# Patient Record
Sex: Female | Born: 1995 | Race: Black or African American | Hispanic: No | Marital: Single | Smoking: Current every day smoker
Health system: Southern US, Community
[De-identification: ages and names within clinical notes are randomized; demographics above are authoritative.]

## PROBLEM LIST (undated history)

## (undated) DIAGNOSIS — E282 Polycystic ovarian syndrome: Secondary | ICD-10-CM

## (undated) DIAGNOSIS — L309 Dermatitis, unspecified: Secondary | ICD-10-CM

## (undated) DIAGNOSIS — K589 Irritable bowel syndrome without diarrhea: Secondary | ICD-10-CM

## (undated) DIAGNOSIS — Z789 Other specified health status: Secondary | ICD-10-CM

## (undated) HISTORY — DX: Other specified health status: Z78.9

## (undated) HISTORY — PX: WISDOM TOOTH EXTRACTION: SHX21

## (undated) HISTORY — DX: Dermatitis, unspecified: L30.9

---
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
## 2015-05-14 ENCOUNTER — Emergency Department (INDEPENDENT_AMBULATORY_CARE_PROVIDER_SITE_OTHER)
Admission: EM | Admit: 2015-05-14 | Discharge: 2015-05-14 | Disposition: A | Discharge status: Home / Self Care | Payer: Medicaid Other | Source: Home / Self Care | Attending: Emergency Medicine | Admitting: Emergency Medicine

## 2015-05-14 ENCOUNTER — Encounter (HOSPITAL_COMMUNITY): Payer: Self-pay | Admitting: Emergency Medicine

## 2015-05-14 DIAGNOSIS — B86 Scabies: Secondary | ICD-10-CM

## 2015-05-14 MED ORDER — HYDROXYZINE HCL 25 MG PO TABS: 25.0000 mg | ORAL_TABLET | Freq: Four times a day (QID) | ORAL | Status: DC | PRN

## 2015-05-14 MED ORDER — PERMETHRIN 5 % EX CREA: TOPICAL_CREAM | CUTANEOUS | Status: DC

## 2015-05-14 NOTE — ED Provider Notes (Signed)
CSN: [id number]     Arrival date & time 05/14/15  1715 History   First MD Initiated Contact with Patient 05/14/15 1746     Chief Complaint  Patient presents with  . Rash   (Consider location/radiation/quality/duration/timing/severity/associated sxs/prior Treatment) HPI She is a 19 year old woman here for evaluation of rash. She states for the last 1-2 weeks she has had itchy bumps in the webspaces of her hands. She is also noticed a few on her wrist. They're very itchy. It gets worse at night. She also reports a few spots on her upper abdomen and behind her knees.  She lives with her sister. As far she knows, her sister does not have any spots.  History reviewed. No pertinent past medical history. History reviewed. No pertinent past surgical history. No family history on file. Social History  Substance Use Topics  . Smoking status: Current Some Day Smoker    Types: Cigarettes  . Smokeless tobacco: None  . Alcohol Use: Yes   OB History    No data available     Review of Systems As in history of present illness Allergies  Review of patient's allergies indicates no known allergies.  Home Medications   Prior to Admission medications   Medication Sig Start Date End Date Taking? Authorizing Provider  hydrOXYzine (ATARAX/VISTARIL) 25 MG tablet Take 1 tablet (25 mg total) by mouth every 6 (six) hours as needed for itching. 05/14/15   Charm RingsErin J Loletta Harper, MD  permethrin (ELIMITE) 5 % cream Apply from neck down, leave 12 hours, then rinse off.  Repeat in 1 week 05/14/15   Charm RingsErin J Hubert Raatz, MD   Meds Ordered and Administered this Visit  Medications - No data to display  BP 104/68 mmHg  Pulse 68  Temp(Src) 98 F (36.7 C) (Oral)  SpO2 100%  LMP 04/30/2015 No data found.   Physical Exam  Constitutional: She is oriented to person, place, and time. She appears well-developed and well-nourished. No distress.  Cardiovascular: Normal rate.   Pulmonary/Chest: Effort normal.  Neurological: She  is alert and oriented to person, place, and time.  Skin: Rash (papules and burrows in the inner webspaces.) noted.    ED Course  Procedures (including critical care time)  Labs Review Labs Reviewed - No data to display  Imaging Review No results found.    MDM   1. Scabies    Handout given. Permethrin. Hydroxyzine for itching. Follow-up as needed.    Charm RingsErin J Layaan Mott, MD 05/14/15 [phone]

## 2015-05-14 NOTE — ED Notes (Signed)
C/o rash on hands/arms and bilateral legs Reports she has recently moved to a new home A&O x4... No acute distress.

## 2015-05-14 NOTE — Discharge Instructions (Signed)

## 2016-02-14 ENCOUNTER — Encounter: Payer: Self-pay | Admitting: Certified Nurse Midwife

## 2016-02-20 ENCOUNTER — Ambulatory Visit (INDEPENDENT_AMBULATORY_CARE_PROVIDER_SITE_OTHER): Payer: Medicaid Other

## 2016-02-20 ENCOUNTER — Encounter: Payer: Self-pay | Admitting: Family Medicine

## 2016-02-20 DIAGNOSIS — Z3201 Encounter for pregnancy test, result positive: Secondary | ICD-10-CM | POA: Diagnosis not present

## 2016-02-20 DIAGNOSIS — Z3200 Encounter for pregnancy test, result unknown: Secondary | ICD-10-CM

## 2016-02-20 LAB — POCT PREGNANCY, URINE: Preg Test, Ur: POSITIVE — AB

## 2016-02-20 NOTE — Progress Notes (Signed)
Patient presented to office today for a pregnancy test.Test confirms she is around [redacted] weeks pregnant at this time. Patient plans to start care at [facility]. She has been given a letter of verification to apply for some assistance.

## 2016-03-14 ENCOUNTER — Encounter: Payer: Self-pay | Admitting: Certified Nurse Midwife

## 2016-03-14 ENCOUNTER — Ambulatory Visit (INDEPENDENT_AMBULATORY_CARE_PROVIDER_SITE_OTHER): Payer: Medicaid Other | Admitting: Certified Nurse Midwife

## 2016-03-14 VITALS — BP 109/70 | HR 85 | Ht 63.0 in

## 2016-03-14 DIAGNOSIS — Z1389 Encounter for screening for other disorder: Secondary | ICD-10-CM | POA: Diagnosis not present

## 2016-03-14 DIAGNOSIS — Z3482 Encounter for supervision of other normal pregnancy, second trimester: Secondary | ICD-10-CM | POA: Diagnosis not present

## 2016-03-14 DIAGNOSIS — Z3480 Encounter for supervision of other normal pregnancy, unspecified trimester: Secondary | ICD-10-CM

## 2016-03-14 DIAGNOSIS — Z3490 Encounter for supervision of normal pregnancy, unspecified, unspecified trimester: Secondary | ICD-10-CM | POA: Insufficient documentation

## 2016-03-14 DIAGNOSIS — Z331 Pregnant state, incidental: Secondary | ICD-10-CM

## 2016-03-14 DIAGNOSIS — Z3687 Encounter for antenatal screening for uncertain dates: Secondary | ICD-10-CM

## 2016-03-14 LAB — POCT URINALYSIS DIPSTICK
Bilirubin, UA: NEGATIVE
Blood, UA: 250
Glucose, UA: NEGATIVE
KETONES UA: NEGATIVE
Nitrite, UA: NEGATIVE
PH UA: 7.5
Protein, UA: NEGATIVE
Spec Grav, UA: <=1.005
Urobilinogen, UA: NEGATIVE

## 2016-03-14 MED ORDER — VITAFOL GUMMIES 3.33-0.333-34.8 MG PO CHEW: 3.0000 | CHEWABLE_TABLET | Freq: Every day | ORAL | 12 refills | Status: DC

## 2016-03-14 NOTE — Progress Notes (Signed)
Subjective:    Melody Patrick is being seen today for her first obstetrical visit.  This is not a planned pregnancy. She is at 3363w3d gestation. Her obstetrical history is significant for none, late teen prenancy, stopped smoking at start of pregnancy. Relationship with FOB: significant other, living together. Patient does not intend to breast feed. Pregnancy history fully reviewed.  The information documented in the HPI was reviewed and verified.  Menstrual History: OB History    Gravida Para Term Preterm AB Living   1             SAB TAB Ectopic Multiple Live Births                  Menarche age: 20 years of age Patient's last menstrual period was 12/11/2015 (approximate).    History reviewed. No pertinent past medical history.  Past Surgical History:  Procedure Laterality Date  . MOUTH SURGERY       (Not in a hospital admission) No Known Allergies  Social History  Substance Use Topics  . Smoking status: Former Smoker    Types: Cigarettes  . Smokeless tobacco: Never Used  . Alcohol use No    Family History  Problem Relation Age of Onset  . Hypertension Maternal Grandmother      Review of Systems Constitutional: negative for weight loss Gastrointestinal: negative for vomiting Genitourinary:negative for genital lesions and vaginal discharge and dysuria Musculoskeletal:negative for back pain Behavioral/Psych: negative for abusive relationship, depression, illegal drug usage and tobacco use    Objective:    BP 109/70   Pulse 85   Ht 5\' 3"  (1.6 m)   LMP 12/11/2015 (Approximate)  General Appearance:    Alert, cooperative, no distress, appears stated age  Head:    Normocephalic, without obvious abnormality, atraumatic  Eyes:    PERRL, conjunctiva/corneas clear, EOM's intact, fundi    benign, both eyes  Ears:    Normal TM's and external ear canals, both ears  Nose:   Nares normal, septum midline, mucosa normal, no drainage    or sinus tenderness  Throat:   Lips,  mucosa, and tongue normal; teeth and gums normal  Neck:   Supple, symmetrical, trachea midline, no adenopathy;    thyroid:  no enlargement/tenderness/nodules; no carotid   bruit or JVD  Back:     Symmetric, no curvature, ROM normal, no CVA tenderness  Lungs:     Clear to auscultation bilaterally, respirations unlabored  Chest Wall:    No tenderness or deformity   Heart:    Regular rate and rhythm, S1 and S2 normal, no murmur, rub   or gallop  Breast Exam:    No tenderness, masses, or nipple abnormality  Abdomen:     Soft, non-tender, bowel sounds active all four quadrants,    no masses, no organomegaly  Genitalia:    Normal female without lesion, discharge or tenderness  Extremities:   Extremities normal, atraumatic, no cyanosis or edema  Pulses:   2+ and symmetric all extremities  Skin:   Skin color, texture, turgor normal, no rashes or lesions  Lymph nodes:   Cervical, supraclavicular, and axillary nodes normal  Neurologic:   CNII-XII intact, normal strength, sensation and reflexes    throughout     Cervix:  Long, thick, closed and posterior.  FH less than U.  FHR: 145. Single fetus via bedside US.      Lab Review Urine pregnancy test Labs reviewed no Radiologic studies reviewed no Assessment:  Pregnancy at [redacted]w[redacted]d weeks   unsure of LMP  Former smoker  Plan:      Prenatal vitamins.  Counseling provided regarding continued use of seat belts, cessation of alcohol consumption, smoking or use of illicit drugs; infection precautions i.e., influenza/TDAP immunizations, toxoplasmosis,CMV, parvovirus, listeria and varicella; workplace safety, exercise during pregnancy; routine dental care, safe medications, sexual activity, hot tubs, saunas, pools, travel, caffeine use, fish and methlymercury, potential toxins, hair treatments, varicose veins Weight gain recommendations per IOM guidelines reviewed: underweight/BMI< 18.5--> gain 28 - 40 lbs; normal weight/BMI 18.5 - 24.9--> gain 25 - 35  lbs; overweight/BMI 25 - 29.9--> gain 15 - 25 lbs; obese/BMI >30->gain  11 - 20 lbs Problem list reviewed and updated. FIRST/CF mutation testing/NIPT/QUAD SCREEN/fragile X/Ashkenazi Jewish population testing/Spinal muscular atrophy discussed: ordered. Role of ultrasound in pregnancy discussed; fetal survey: requested. Amniocentesis discussed: not indicated. VBAC calculator score: VBAC consent form provided Meds ordered this encounter  Medications  . Prenatal Vit-Fe Phos-FA-Omega (VITAFOL GUMMIES) 3.33-0.333-34.8 MG CHEW    Sig: Chew 3 tablets by mouth daily.    Dispense:  90 tablet    Refill:  12   Orders Placed This Encounter  Procedures  . Culture, OB Urine  . US OB Comp Less 14 Wks    Standing Status:   Future    Standing Expiration Date:   05/14/2017    Order Specific Question:   Reason for Exam (SYMPTOM  OR DIAGNOSIS REQUIRED)    Answer:   dating    Order Specific Question:   Preferred imaging location?    Answer:   Internal  . HIV antibody  . Hemoglobinopathy evaluation  . Varicella zoster antibody, IgG  . VITAMIN D 25 Hydroxy (Vit-D Deficiency, Fractures)  . Prenatal Profile I  . ToxASSURE Select 13 (MW), Urine  . Hemoglobin A1c  . MaterniT21 PLUS Core+SCA    Order Specific Question:   Is the patient insulin dependent?    Answer:   No    Order Specific Question:   Please enter gestational age. This should be expressed as weeks AND days, i.e. 16w 6d. Enter weeks here. Enter days in next question.    Answer:   9    Order Specific Question:   Please enter gestational age. This should be expressed as weeks AND days, i.e. 16w 6d. Enter days here. Enter weeks in previous question.    Answer:   3    Order Specific Question:   How was gestational age calculated?    Answer:   LMP    Order Specific Question:   Please give the date of LMP OR Ultrasound OR Estimated date of delivery.    Answer:   09/16/2016    Order Specific Question:   Number of Fetuses (Type of Pregnancy):     Answer:   1    Order Specific Question:   Indications for performing the test? (please choose all that apply):    Answer:   Routine screening    Order Specific Question:   Other Indications? (Y=Yes, N=No)    Answer:   N    Order Specific Question:   If this is a repeat specimen, please indicate the reason:    Answer:   Not indicated    Order Specific Question:   Please specify the patient's race: (C=White/Caucasion, B=Black, I=Native American, A=Asian, H=Hispanic, O=Other, U=Unknown)    Answer:   B    Order Specific Question:   Donor Egg - indicate if the egg was obtained  from in vitro fertilization.    Answer:   N    Order Specific Question:   Age of Egg Donor.    Answer:   89    Order Specific Question:   Prior Down Syndrome/ONTD screening during current pregnancy.    Answer:   N    Order Specific Question:   Prior First Trimester Testing    Answer:   N    Order Specific Question:   Prior Second Trimester Testing    Answer:   N    Order Specific Question:   Family History of Neural Tube Defects    Answer:   N    Order Specific Question:   Prior Pregnancy with Down Syndrome    Answer:   N    Order Specific Question:   Please give the patient's weight (in pounds)    Answer:   154  . POCT urinalysis dipstick    Follow up in 4 weeks. 50% of 30 min visit spent on counseling and coordination of care.

## 2016-03-15 ENCOUNTER — Other Ambulatory Visit: Payer: Self-pay | Admitting: Certified Nurse Midwife

## 2016-03-16 LAB — CULTURE, OB URINE

## 2016-03-16 LAB — URINE CULTURE, OB REFLEX

## 2016-03-19 ENCOUNTER — Other Ambulatory Visit: Payer: Self-pay | Admitting: Certified Nurse Midwife

## 2016-03-19 DIAGNOSIS — A599 Trichomoniasis, unspecified: Secondary | ICD-10-CM

## 2016-03-19 DIAGNOSIS — B3731 Acute candidiasis of vulva and vagina: Secondary | ICD-10-CM

## 2016-03-19 DIAGNOSIS — N760 Acute vaginitis: Principal | ICD-10-CM

## 2016-03-19 DIAGNOSIS — B373 Candidiasis of vulva and vagina: Secondary | ICD-10-CM

## 2016-03-19 DIAGNOSIS — B9689 Other specified bacterial agents as the cause of diseases classified elsewhere: Secondary | ICD-10-CM

## 2016-03-19 LAB — NUSWAB VG+, CANDIDA 6SP
Atopobium vaginae: HIGH Score — AB
BVAB 2: HIGH Score — AB
CANDIDA ALBICANS, NAA: POSITIVE — AB
CANDIDA KRUSEI, NAA: NEGATIVE
Candida glabrata, NAA: NEGATIVE
Candida lusitaniae, NAA: NEGATIVE
Candida parapsilosis, NAA: NEGATIVE
Candida tropicalis, NAA: NEGATIVE
Chlamydia trachomatis, NAA: NEGATIVE
Megasphaera 1: HIGH Score — AB
NEISSERIA GONORRHOEAE, NAA: NEGATIVE
Trich vag by NAA: POSITIVE — AB

## 2016-03-19 MED ORDER — FLUCONAZOLE 100 MG PO TABS: 100.0000 mg | ORAL_TABLET | Freq: Once | ORAL | 0 refills | Status: AC

## 2016-03-19 MED ORDER — METRONIDAZOLE 500 MG PO TABS: 2000.0000 mg | ORAL_TABLET | Freq: Once | ORAL | 0 refills | Status: AC

## 2016-03-19 MED ORDER — TERCONAZOLE 0.8 % VA CREA: 1.0000 | TOPICAL_CREAM | Freq: Every day | VAGINAL | 0 refills | Status: DC

## 2016-03-19 MED ORDER — METRONIDAZOLE 0.75 % VA GEL: 1.0000 | Freq: Two times a day (BID) | VAGINAL | 0 refills | Status: DC

## 2016-03-25 ENCOUNTER — Other Ambulatory Visit: Payer: Self-pay | Admitting: Certified Nurse Midwife

## 2016-03-25 DIAGNOSIS — Z3400 Encounter for supervision of normal first pregnancy, unspecified trimester: Secondary | ICD-10-CM

## 2016-03-25 LAB — MATERNIT21 PLUS CORE+SCA
CHROMOSOME 18: NEGATIVE
Chromosome 13: NEGATIVE
Chromosome 21: NEGATIVE
PDF: 0
Y CHROMOSOME: NOT DETECTED

## 2016-03-26 ENCOUNTER — Ambulatory Visit (INDEPENDENT_AMBULATORY_CARE_PROVIDER_SITE_OTHER): Payer: Medicaid Other

## 2016-03-26 ENCOUNTER — Other Ambulatory Visit: Payer: Self-pay | Admitting: Certified Nurse Midwife

## 2016-03-26 DIAGNOSIS — Z3687 Encounter for antenatal screening for uncertain dates: Secondary | ICD-10-CM

## 2016-03-26 LAB — PRENATAL PROFILE I(LABCORP)
BASOS: 0 %
Basophils Absolute: 0 10*3/uL (ref 0.0–0.2)
EOS (ABSOLUTE): 0.1 10*3/uL (ref 0.0–0.4)
EOS: 1 %
HEMATOCRIT: 34.8 % (ref 34.0–46.6)
HEP B S AG: NEGATIVE
Hemoglobin: 11.7 g/dL (ref 11.1–15.9)
IMMATURE GRANS (ABS): 0 10*3/uL (ref 0.0–0.1)
Immature Granulocytes: 0 %
LYMPHS: 18 %
Lymphocytes Absolute: 2.7 10*3/uL (ref 0.7–3.1)
MCH: 26.8 pg (ref 26.6–33.0)
MCHC: 33.6 g/dL (ref 31.5–35.7)
MCV: 80 fL (ref 79–97)
MONOS ABS: 1.1 10*3/uL — AB (ref 0.1–0.9)
Monocytes: 7 %
Neutrophils Absolute: 11.5 10*3/uL — ABNORMAL HIGH (ref 1.4–7.0)
Neutrophils: 74 %
Platelets: 371 10*3/uL (ref 150–379)
RBC: 4.37 x10E6/uL (ref 3.77–5.28)
RDW: 14.6 % (ref 12.3–15.4)
RH TYPE: POSITIVE
RPR: NONREACTIVE
RUBELLA: 1.74 {index} (ref >0.99)
WBC: 15.5 10*3/uL — ABNORMAL HIGH (ref 3.4–10.8)

## 2016-03-26 LAB — HEMOGLOBINOPATHY EVALUATION
HGB A: 97.3 % (ref 94.0–98.0)
HGB C: 0 %
HGB S: 0 %
Hemoglobin A2 Quantitation: 2.1 % (ref 0.7–3.1)
Hemoglobin F Quantitation: 0.6 % (ref 0.0–2.0)

## 2016-03-26 LAB — VARICELLA ZOSTER ANTIBODY, IGG: Varicella zoster IgG: 2880 index (ref >165)

## 2016-03-26 LAB — AB SCR+ANTIBODY ID: Antibody Screen: POSITIVE — AB

## 2016-03-26 LAB — HEMOGLOBIN A1C
Est. average glucose Bld gHb Est-mCnc: 103 mg/dL
HEMOGLOBIN A1C: 5.2 % (ref 4.8–5.6)

## 2016-03-26 LAB — TOXASSURE SELECT 13 (MW), URINE

## 2016-03-26 LAB — VITAMIN D 25 HYDROXY (VIT D DEFICIENCY, FRACTURES): VIT D 25 HYDROXY: 15.6 ng/mL — AB (ref 30.0–100.0)

## 2016-03-26 LAB — HIV ANTIBODY (ROUTINE TESTING W REFLEX): HIV Screen 4th Generation wRfx: NONREACTIVE

## 2016-03-28 ENCOUNTER — Telehealth: Payer: Self-pay

## 2016-03-28 NOTE — Telephone Encounter (Signed)
S/w pt and informed about lab results and prescriptions sent.

## 2016-04-01 ENCOUNTER — Encounter: Payer: Self-pay | Admitting: *Deleted

## 2016-04-09 ENCOUNTER — Other Ambulatory Visit: Payer: Self-pay | Admitting: Certified Nurse Midwife

## 2016-04-09 DIAGNOSIS — Z3400 Encounter for supervision of normal first pregnancy, unspecified trimester: Secondary | ICD-10-CM

## 2016-04-11 ENCOUNTER — Ambulatory Visit (INDEPENDENT_AMBULATORY_CARE_PROVIDER_SITE_OTHER): Payer: Medicaid Other | Admitting: Obstetrics & Gynecology

## 2016-04-11 DIAGNOSIS — Z3400 Encounter for supervision of normal first pregnancy, unspecified trimester: Secondary | ICD-10-CM

## 2016-04-11 DIAGNOSIS — Z3402 Encounter for supervision of normal first pregnancy, second trimester: Secondary | ICD-10-CM

## 2016-04-11 NOTE — Progress Notes (Signed)
   PRENATAL VISIT NOTE  Subjective:  Melody Patrick is a 20 y.o. G1P0 at 3338w3d being seen today for ongoing prenatal care.  She is currently monitored for the following issues for this low-risk pregnancy and has Supervision of normal pregnancy, antepartum and Irregular menses on her problem list.  Patient reports no complaints.  Contractions: Not present. Vag. Bleeding: None.  Movement: Present. Denies leaking of fluid.   The following portions of the patient's history were reviewed and updated as appropriate: allergies, current medications, past family history, past medical history, past social history, past surgical history and problem list. Problem list updated.  Objective:   Vitals:   04/11/16 1026  BP: 117/75  Pulse: 76  Temp: 98.3 F (36.8 C)  Weight: 70.4 kg (155 lb 3.2 oz)    Fetal Status: Fetal Heart Rate (bpm): 142   Movement: Present     General:  Alert, oriented and cooperative. Patient is in no acute distress.  Skin: Skin is warm and dry. No rash noted.   Cardiovascular: Normal heart rate noted  Respiratory: Normal respiratory effort, no problems with respiration noted  Abdomen: Soft, gravid, appropriate for gestational age. Pain/Pressure: Absent     Pelvic:  Cervical exam deferred        Extremities: Normal range of motion.  Edema: None  Mental Status: Normal mood and affect. Normal behavior. Normal judgment and thought content.   Assessment and Plan:  Pregnancy: G1P0 at 7838w3d  1. Supervision of normal first pregnancy, antepartum Anatomy scan in 2 weeks - Antibody identification; Future - AFP, Quad Screen  Preterm labor symptoms and general obstetric precautions including but not limited to vaginal bleeding, contractions, leaking of fluid and fetal movement were reviewed in detail with the patient. Please refer to After Visit Summary for other counseling recommendations.  Return in about 4 weeks (around 05/09/2016).  Adam PhenixJames G Donis Kotowski, MD

## 2016-04-11 NOTE — Patient Instructions (Signed)

## 2016-04-11 NOTE — Progress Notes (Signed)
Patient states that she has been feeling good thus far, reports feeling fetal flutters.

## 2016-04-13 LAB — AFP, QUAD SCREEN
DIA Mom Value: 1.08
DIA VALUE (EIA): 182.91 pg/mL
DSR (By Age)    1 IN: 1158
DSR (Second Trimester) 1 IN: 10000
GESTATIONAL AGE AFP: 17.4 wk
MSAFP Mom: 0.65
MSAFP: 27.5 ng/mL
MSHCG Mom: 0.28
MSHCG: 8204 m[IU]/mL
Maternal Age At EDD: 20.4 YEARS
OSB RISK: 10000
T18 (By Age): 1:4511 {titer}
Test Results:: NEGATIVE
UE3 MOM: 0.85
WEIGHT: 155 [lb_av]
uE3 Value: 0.98 ng/mL

## 2016-04-13 LAB — ANTIBODY IDENTIFICATION

## 2016-04-23 ENCOUNTER — Other Ambulatory Visit: Payer: Self-pay | Admitting: Certified Nurse Midwife

## 2016-04-23 DIAGNOSIS — B373 Candidiasis of vulva and vagina: Secondary | ICD-10-CM

## 2016-04-23 DIAGNOSIS — B3731 Acute candidiasis of vulva and vagina: Secondary | ICD-10-CM

## 2016-04-23 DIAGNOSIS — A599 Trichomoniasis, unspecified: Secondary | ICD-10-CM

## 2016-04-26 MED ORDER — TERCONAZOLE 0.8 % VA CREA: 1.0000 | TOPICAL_CREAM | Freq: Every day | VAGINAL | 0 refills | Status: DC

## 2016-04-26 MED ORDER — METRONIDAZOLE 0.75 % VA GEL: 1.0000 | Freq: Two times a day (BID) | VAGINAL | 0 refills | Status: DC

## 2016-04-30 ENCOUNTER — Ambulatory Visit: Payer: Medicaid Other

## 2016-04-30 DIAGNOSIS — Z3400 Encounter for supervision of normal first pregnancy, unspecified trimester: Secondary | ICD-10-CM

## 2016-05-08 ENCOUNTER — Ambulatory Visit (INDEPENDENT_AMBULATORY_CARE_PROVIDER_SITE_OTHER): Payer: Medicaid Other | Admitting: Obstetrics and Gynecology

## 2016-05-08 VITALS — BP 98/67 | HR 77 | Temp 98.1°F | Wt 154.8 lb

## 2016-05-08 DIAGNOSIS — Z3400 Encounter for supervision of normal first pregnancy, unspecified trimester: Secondary | ICD-10-CM

## 2016-05-08 DIAGNOSIS — Z3402 Encounter for supervision of normal first pregnancy, second trimester: Secondary | ICD-10-CM

## 2016-05-08 NOTE — Progress Notes (Signed)
Patient states that she feels good, reports good fetal movement. 

## 2016-05-08 NOTE — Progress Notes (Signed)
   PRENATAL VISIT NOTE  Subjective:  Melody Patrick is a 20 y.o. G1P0 at 4776w2d being seen today for ongoing prenatal care.  She is currently monitored for the following issues for this low-risk pregnancy and has Supervision of normal pregnancy, antepartum on her problem list.  Patient reports no complaints.  Contractions: Not present. Vag. Bleeding: None.  Movement: Present. Denies leaking of fluid.   The following portions of the patient's history were reviewed and updated as appropriate: allergies, current medications, past family history, past medical history, past social history, past surgical history and problem list. Problem list updated.  Objective:   Vitals:   05/08/16 1402  BP: 98/67  Pulse: 77  Temp: 98.1 F (36.7 C)  Weight: 154 lb 12.8 oz (70.2 kg)    Fetal Status: Fetal Heart Rate (bpm): 152 Fundal Height: 21 cm Movement: Present     General:  Alert, oriented and cooperative. Patient is in no acute distress.  Skin: Skin is warm and dry. No rash noted.   Cardiovascular: Normal heart rate noted  Respiratory: Normal respiratory effort, no problems with respiration noted  Abdomen: Soft, gravid, appropriate for gestational age. Pain/Pressure: Absent     Pelvic:  Cervical exam deferred        Extremities: Normal range of motion.  Edema: None  Mental Status: Normal mood and affect. Normal behavior. Normal judgment and thought content.   Assessment and Plan:  Pregnancy: G1P0 at 276w2d  1. Supervision of normal first pregnancy, antepartum Patient is doing well without complaints Anatomy ultrasound report not available for review. Patient will be contacted with any abnormal results and follow up will be arranged  General obstetric precautions including but not limited to vaginal bleeding, contractions, leaking of fluid and fetal movement were reviewed in detail with the patient. Please refer to After Visit Summary for other counseling recommendations.  Return in about 4  weeks (around 06/05/2016).   Catalina AntiguaPeggy Gawain Crombie, MD

## 2016-05-13 ENCOUNTER — Telehealth: Payer: Self-pay | Admitting: *Deleted

## 2016-05-13 NOTE — Telephone Encounter (Signed)
Patient wants to know what cold medication she can take. 11:25 Call to patient- she states she does not have a fever- just cold and cough. Recommended the OTC medications on list.

## 2016-06-05 ENCOUNTER — Ambulatory Visit (INDEPENDENT_AMBULATORY_CARE_PROVIDER_SITE_OTHER): Payer: Medicaid Other | Admitting: Obstetrics & Gynecology

## 2016-06-05 VITALS — BP 119/76 | HR 89 | Wt 159.4 lb

## 2016-06-05 DIAGNOSIS — Z3402 Encounter for supervision of normal first pregnancy, second trimester: Secondary | ICD-10-CM

## 2016-06-05 DIAGNOSIS — Z3400 Encounter for supervision of normal first pregnancy, unspecified trimester: Secondary | ICD-10-CM

## 2016-06-05 NOTE — Patient Instructions (Signed)
Second Trimester of Pregnancy The second trimester is from week 13 through week 28 (months 4 through 6). The second trimester is often a time when you feel your best. Your body has also adjusted to being pregnant, and you begin to feel better physically. Usually, morning sickness has lessened or quit completely, you may have more energy, and you may have an increase in appetite. The second trimester is also a time when the fetus is growing rapidly. At the end of the sixth month, the fetus is about 9 inches long and weighs about 1 pounds. You will likely begin to feel the baby move (quickening) between 18 and 20 weeks of the pregnancy. Body changes during your second trimester Your body continues to go through many changes during your second trimester. The changes vary from woman to woman.  Your weight will continue to increase. You will notice your lower abdomen bulging out.  You may begin to get stretch marks on your hips, abdomen, and breasts.  You may develop headaches that can be relieved by medicines. The medicines should be approved by your health care provider.  You may urinate more often because the fetus is pressing on your bladder.  You may develop or continue to have heartburn as a result of your pregnancy.  You may develop constipation because certain hormones are causing the muscles that push waste through your intestines to slow down.  You may develop hemorrhoids or swollen, bulging veins (varicose veins).  You may have back pain. This is caused by:  Weight gain.  Pregnancy hormones that are relaxing the joints in your pelvis.  A shift in weight and the muscles that support your balance.  Your breasts will continue to grow and they will continue to become tender.  Your gums may bleed and may be sensitive to brushing and flossing.  Dark spots or blotches (chloasma, mask of pregnancy) may develop on your face. This will likely fade after the baby is born.  A dark line  from your belly button to the pubic area (linea nigra) may appear. This will likely fade after the baby is born.  You may have changes in your hair. These can include thickening of your hair, rapid growth, and changes in texture. Some women also have hair loss during or after pregnancy, or hair that feels dry or thin. Your hair will most likely return to normal after your baby is born. What to expect at prenatal visits During a routine prenatal visit:  You will be weighed to make sure you and the fetus are growing normally.  Your blood pressure will be taken.  Your abdomen will be measured to track your baby's growth.  The fetal heartbeat will be listened to.  Any test results from the previous visit will be discussed. Your health care provider may ask you:  How you are feeling.  If you are feeling the baby move.  If you have had any abnormal symptoms, such as leaking fluid, bleeding, severe headaches, or abdominal cramping.  If you are using any tobacco products, including cigarettes, chewing tobacco, and electronic cigarettes.  If you have any questions. Other tests that may be performed during your second trimester include:  Blood tests that check for:  Low iron levels (anemia).  Gestational diabetes (between 24 and 28 weeks).  Rh antibodies. This is to check for a protein on red blood cells (Rh factor).  Urine tests to check for infections, diabetes, or protein in the urine.  An ultrasound to   confirm the proper growth and development of the baby.  An amniocentesis to check for possible genetic problems.  Fetal screens for spina bifida and Down syndrome.  HIV (human immunodeficiency virus) testing. Routine prenatal testing includes screening for HIV, unless you choose not to have this test. Follow these instructions at home: Eating and drinking  Continue to eat regular, healthy meals.  Avoid raw meat, uncooked cheese, cat litter boxes, and soil used by cats. These  carry germs that can cause birth defects in the baby.  Take your prenatal vitamins.  Take 1500-2000 mg of calcium daily starting at the 20th week of pregnancy until you deliver your baby.  If you develop constipation:  Take over-the-counter or prescription medicines.  Drink enough fluid to keep your urine clear or pale yellow.  Eat foods that are high in fiber, such as fresh fruits and vegetables, whole grains, and beans.  Limit foods that are high in fat and processed sugars, such as fried and sweet foods. Activity  Exercise only as directed by your health care provider. Experiencing uterine cramps is a good sign to stop exercising.  Avoid heavy lifting, wear low heel shoes, and practice good posture.  Wear your seat belt at all times when driving.  Rest with your legs elevated if you have leg cramps or low back pain.  Wear a good support bra for breast tenderness.  Do not use hot tubs, steam rooms, or saunas. Lifestyle  Avoid all smoking, herbs, alcohol, and unprescribed drugs. These chemicals affect the formation and growth of the baby.  Do not use any products that contain nicotine or tobacco, such as cigarettes and e-cigarettes. If you need help quitting, ask your health care provider.  A sexual relationship may be continued unless your health care provider directs you otherwise. General instructions  Follow your health care provider's instructions regarding medicine use. There are medicines that are either safe or unsafe to take during pregnancy.  Take warm sitz baths to soothe any pain or discomfort caused by hemorrhoids. Use hemorrhoid cream if your health care provider approves.  If you develop varicose veins, wear support hose. Elevate your feet for 15 minutes, 3-4 times a day. Limit salt in your diet.  Visit your dentist if you have not gone yet during your pregnancy. Use a soft toothbrush to brush your teeth and be gentle when you floss.  Keep all follow-up  prenatal visits as told by your health care provider. This is important. Contact a health care provider if:  You have dizziness.  You have mild pelvic cramps, pelvic pressure, or nagging pain in the abdominal area.  You have persistent nausea, vomiting, or diarrhea.  You have a bad smelling vaginal discharge.  You have pain with urination. Get help right away if:  You have a fever.  You are leaking fluid from your vagina.  You have spotting or bleeding from your vagina.  You have severe abdominal cramping or pain.  You have rapid weight gain or weight loss.  You have shortness of breath with chest pain.  You notice sudden or extreme swelling of your face, hands, ankles, feet, or legs.  You have not felt your baby move in over an hour.  You have severe headaches that do not go away with medicine.  You have vision changes. Summary  The second trimester is from week 13 through week 28 (months 4 through 6). It is also a time when the fetus is growing rapidly.  Your body goes   through many changes during pregnancy. The changes vary from woman to woman.  Avoid all smoking, herbs, alcohol, and unprescribed drugs. These chemicals affect the formation and growth your baby.  Do not use any tobacco products, such as cigarettes, chewing tobacco, and e-cigarettes. If you need help quitting, ask your health care provider.  Contact your health care provider if you have any questions. Keep all prenatal visits as told by your health care provider. This is important. This information is not intended to replace advice given to you by your health care provider. Make sure you discuss any questions you have with your health care provider. Document Released: 05/28/2001 Document Revised: 11/09/2015 Document Reviewed: 08/04/2012 Elsevier Interactive Patient Education  2017 [employer]  

## 2016-06-05 NOTE — Progress Notes (Signed)
   PRENATAL VISIT NOTE  Subjective:  Melody Patrick is a 20 y.o. G1P0 at 7445w2d being seen today for ongoing prenatal care.  She is currently monitored for the following issues for this low-risk pregnancy and has Supervision of normal pregnancy, antepartum on her problem list.  Patient reports no complaints.  Contractions: Not present. Vag. Bleeding: None.  Movement: Present. Denies leaking of fluid.   The following portions of the patient's history were reviewed and updated as appropriate: allergies, current medications, past family history, past medical history, past social history, past surgical history and problem list. Problem list updated.  Objective:   Vitals:   06/05/16 1043  BP: 119/76  Pulse: 89  Weight: 159 lb 6.4 oz (72.3 kg)    Fetal Status: Fetal Heart Rate (bpm): 152   Movement: Present     General:  Alert, oriented and cooperative. Patient is in no acute distress.  Skin: Skin is warm and dry. No rash noted.   Cardiovascular: Normal heart rate noted  Respiratory: Normal respiratory effort, no problems with respiration noted  Abdomen: Soft, gravid, appropriate for gestational age. Pain/Pressure: Absent     Pelvic:  Cervical exam deferred        Extremities: Normal range of motion.  Edema: None  Mental Status: Normal mood and affect. Normal behavior. Normal judgment and thought content.   Assessment and Plan:  Pregnancy: G1P0 at 7945w2d  1. Supervision of normal first pregnancy, antepartum Doing well, will do BG testing next visit  Preterm labor symptoms and general obstetric precautions including but not limited to vaginal bleeding, contractions, leaking of fluid and fetal movement were reviewed in detail with the patient. Please refer to After Visit Summary for other counseling recommendations.  Return in about 3 weeks (around 06/26/2016) for 2 hr GTT.   Melody PhenixJames G Mariaeduarda Defranco, MD

## 2016-06-05 NOTE — Progress Notes (Signed)
Patient reports she is doing well 

## 2016-06-17 NOTE — L&D Delivery Note (Signed)
Vaginal Delivery Note  21 y.o. G1P0 at 2128w2d delivered a viable female infant at [id number] in cephalic, OA position. No nuchal cord. Right anterior shoulder delivered with ease. Sixty sec delayed cord clamping. Cord clamped x2 and cut. Placenta delivered spontaneously intact, with 3VC. Fundus firm on exam with massage and pitocin.  Mother: Anesthesia: Epidural Laceration: None Suture repair: N/A EBL: 100 mL  Baby: Apgars: 8, 9 Weight: Pending Cord pH: Not sent  Good hemostasis noted. Mom to postpartum.  Baby to Couplet care / Skin to Skin.  Wendee Beaversavid J McMullen, DO, PGY-1 09/11/2016, 3:36 AM

## 2016-06-27 ENCOUNTER — Ambulatory Visit (INDEPENDENT_AMBULATORY_CARE_PROVIDER_SITE_OTHER): Payer: Medicaid Other | Admitting: Obstetrics & Gynecology

## 2016-06-27 ENCOUNTER — Other Ambulatory Visit: Payer: Medicaid Other

## 2016-06-27 DIAGNOSIS — O2603 Excessive weight gain in pregnancy, third trimester: Secondary | ICD-10-CM

## 2016-06-27 DIAGNOSIS — Z23 Encounter for immunization: Secondary | ICD-10-CM

## 2016-06-27 DIAGNOSIS — Z3400 Encounter for supervision of normal first pregnancy, unspecified trimester: Secondary | ICD-10-CM

## 2016-06-27 DIAGNOSIS — Z3403 Encounter for supervision of normal first pregnancy, third trimester: Secondary | ICD-10-CM

## 2016-06-27 DIAGNOSIS — O2600 Excessive weight gain in pregnancy, unspecified trimester: Secondary | ICD-10-CM | POA: Insufficient documentation

## 2016-06-27 MED ORDER — FAMOTIDINE 40 MG PO TABS: 40.0000 mg | ORAL_TABLET | Freq: Every evening | ORAL | 2 refills | Status: DC

## 2016-06-27 NOTE — Progress Notes (Signed)
No weight gain, occasional heartburn no nausea    PRENATAL VISIT NOTE  Subjective:  Melody Patrick is a 21 y.o. G1P0 at 2935w3d being seen today for ongoing prenatal care.  She is currently monitored for the following issues for this low-risk pregnancy and has Supervision of normal pregnancy, antepartum and Abnormal weight gain during pregnancy, antepartum on her problem list.  Patient reports heartburn.  Contractions: Not present. Vag. Bleeding: None.  Movement: Present. Denies leaking of fluid.   The following portions of the patient's history were reviewed and updated as appropriate: allergies, current medications, past family history, past medical history, past social history, past surgical history and problem list. Problem list updated.  Objective:   Vitals:   06/27/16 0856  BP: 109/74  Pulse: 75  Temp: 98.6 F (37 C)  Weight: 156 lb 14.4 oz (71.2 kg)    Fetal Status: Fetal Heart Rate (bpm): 142 Fundal Height: 29 cm Movement: Present     General:  Alert, oriented and cooperative. Patient is in no acute distress.  Skin: Skin is warm and dry. No rash noted.   Cardiovascular: Normal heart rate noted  Respiratory: Normal respiratory effort, no problems with respiration noted  Abdomen: Soft, gravid, appropriate for gestational age. Pain/Pressure: Absent     Pelvic:  Cervical exam deferred        Extremities: Normal range of motion.  Edema: None  Mental Status: Normal mood and affect. Normal behavior. Normal judgment and thought content.   Assessment and Plan:  Pregnancy: G1P0 at 4635w3d  1. Supervision of normal first pregnancy, antepartum 28 week testing - Glucose Tolerance, 2 Hours w/1 Hour - CBC - HIV antibody (with reflex) - RPR  2. Abnormal weight gain during pregnancy, antepartum No gain, referral to nutrition - Referral to Nutrition and Diabetes Services  Preterm labor symptoms and general obstetric precautions including but not limited to vaginal bleeding,  contractions, leaking of fluid and fetal movement were reviewed in detail with the patient. Please refer to After Visit Summary for other counseling recommendations.  Return in about 2 weeks (around 07/11/2016).   Adam PhenixJames G Kameran Mcneese, MD

## 2016-06-27 NOTE — Progress Notes (Signed)
Patient is in the office, reports good fetal movement. 

## 2016-06-28 ENCOUNTER — Encounter: Payer: Self-pay | Admitting: Certified Nurse Midwife

## 2016-06-28 LAB — GLUCOSE TOLERANCE, 2 HOURS W/ 1HR
GLUCOSE, 1 HOUR: 107 mg/dL (ref 65–179)
Glucose, 2 hour: 91 mg/dL (ref 65–152)
Glucose, Fasting: 75 mg/dL (ref 65–91)

## 2016-06-28 LAB — CBC
Hematocrit: 34.1 % (ref 34.0–46.6)
Hemoglobin: 11 g/dL — ABNORMAL LOW (ref 11.1–15.9)
MCH: 26.8 pg (ref 26.6–33.0)
MCHC: 32.3 g/dL (ref 31.5–35.7)
MCV: 83 fL (ref 79–97)
Platelets: 354 10*3/uL (ref 150–379)
RBC: 4.11 x10E6/uL (ref 3.77–5.28)
RDW: 14.6 % (ref 12.3–15.4)
WBC: 12.6 10*3/uL — ABNORMAL HIGH (ref 3.4–10.8)

## 2016-06-28 LAB — HIV ANTIBODY (ROUTINE TESTING W REFLEX): HIV Screen 4th Generation wRfx: NONREACTIVE

## 2016-06-28 LAB — RPR: RPR Ser Ql: NONREACTIVE

## 2016-07-08 ENCOUNTER — Encounter: Payer: Medicaid Other | Attending: Obstetrics & Gynecology | Admitting: Dietician

## 2016-07-08 DIAGNOSIS — O2600 Excessive weight gain in pregnancy, unspecified trimester: Secondary | ICD-10-CM | POA: Insufficient documentation

## 2016-07-08 DIAGNOSIS — Z713 Dietary counseling and surveillance: Secondary | ICD-10-CM | POA: Insufficient documentation

## 2016-07-08 NOTE — Patient Instructions (Signed)
Have protein with each meal and snack.  Milk, yogurt, eggs, meat, cheese, beans. Have breakfast, lunch, and dinner daily. Have 2-3 snacks daily. Include milk or yogurt 3 times per day. Consider trying a vegetable every day. Work to increase the nutrition quality of your foods. Continue to drink water.  (aim for 12 eight ounce cups per day).

## 2016-07-08 NOTE — Progress Notes (Signed)
  Medical Nutrition Therapy:  Appt start time: 0945 end time:  6433.  Patient arrived late.   Assessment:  Primary concerns today: Patient is here alone.  She is here today because she has not gained any weight during pregnancy.  She is [redacted] weeks gestation.  She states that there are no problems with the fetus and growth seems normal.  Her weight today is 157 lbs which is her pre pregnancy weight.  Nausea earlier in pregnancy effected her eating. She complains of heartburn but states that this does not effect her ability to eat.  She is very picky and states that she always has been.  She likes increased sweets, fast food.  Overall, her diet pre and post pregnancy is very poor quality.    Patient lives with her mom and sisters.  They all shop.  She does not like to cook.  Others in the household cook.  She works at [employer] 4 pm-10 pm.  Preferred Learning Style:   No preference indicated   Learning Readiness:   Ready   MEDICATIONS: prenatal vitamin   DIETARY INTAKE:  Usual eating pattern includes 2-3 meals and 1-2 snacks per day.  Everyday foods include increased sweet foods.  Avoided foods include vegetables because she dislikes, pasta sauce and soda, juice because it gives her heartburn.  24-hr recall:  B ( AM): candy, cereal with whole or 2% milk Snk ( AM): none  L ( PM): pancakes with syrup and occasional eggs or sausage Snk ( PM): chips and ice cream D ( PM): sometimes skips OR mac and cheese OR Wendy's or McDonald's Snk ( PM): chips and ice cream Beverages: water, occasional lemonade  Usual physical activity: stands all day at work  Estimated energy needs: 2200 calories 70 g protein  Progress Towards Goal(s):  In progress.   Nutritional Diagnosis:  NB-1.1 Food and nutrition-related knowledge deficit As related to nutrition for adequate weight gain druing pregnancy.  As evidenced by no weight gain during pregnancy.    Intervention:  Nutrition counseling/education  related to healthy eating during pregnancy and increasing the nutritional quality of her diet.    Have protein with each meal and snack.  Milk, yogurt, eggs, meat, cheese, beans. Have breakfast, lunch, and dinner daily. Have 2-3 snacks daily. Include milk or yogurt 3 times per day. Consider trying a vegetable every day. Work to increase the nutrition quality of your foods. Continue to drink water.  (aim for 12 eight ounce cups per day).  Teaching Method Utilized:  Visual Auditory Hands on  Handouts given during visit include:  Nutrition and pregnancy from AND  Weight gain sheet  Barriers to learning/adherence to lifestyle change: social situation  Demonstrated degree of understanding via:  Teach Back   Monitoring/Evaluation:  Dietary intake, exercise, and body weight prn.

## 2016-07-12 ENCOUNTER — Ambulatory Visit (INDEPENDENT_AMBULATORY_CARE_PROVIDER_SITE_OTHER): Payer: Medicaid Other | Admitting: Certified Nurse Midwife

## 2016-07-12 VITALS — BP 116/73 | HR 79 | Wt 159.6 lb

## 2016-07-12 DIAGNOSIS — O2600 Excessive weight gain in pregnancy, unspecified trimester: Secondary | ICD-10-CM

## 2016-07-12 DIAGNOSIS — O2603 Excessive weight gain in pregnancy, third trimester: Secondary | ICD-10-CM

## 2016-07-12 DIAGNOSIS — Z3400 Encounter for supervision of normal first pregnancy, unspecified trimester: Secondary | ICD-10-CM

## 2016-07-12 NOTE — Progress Notes (Signed)
Patient did not get reflux medication- it was not covered by insurance

## 2016-07-17 NOTE — Progress Notes (Signed)
   PRENATAL VISIT NOTE  Subjective:  Melody Patrick is a 21 y.o. G1P0 at 4941w2d being seen today for ongoing prenatal care.  She is currently monitored for the following issues for this low-risk pregnancy and has Supervision of normal pregnancy, antepartum and Abnormal weight gain during pregnancy, antepartum on her problem list.  Patient reports backache.  Contractions: Not present. Vag. Bleeding: None.  Movement: Present. Denies leaking of fluid.   The following portions of the patient's history were reviewed and updated as appropriate: allergies, current medications, past family history, past medical history, past social history, past surgical history and problem list. Problem list updated.  Objective:   Vitals:   07/12/16 1040  BP: 116/73  Pulse: 79  Weight: 159 lb 9.6 oz (72.4 kg)    Fetal Status: Fetal Heart Rate (bpm): 145 Fundal Height: 30 cm Movement: Present     General:  Alert, oriented and cooperative. Patient is in no acute distress.  Skin: Skin is warm and dry. No rash noted.   Cardiovascular: Normal heart rate noted  Respiratory: Normal respiratory effort, no problems with respiration noted  Abdomen: Soft, gravid, appropriate for gestational age. Pain/Pressure: Absent     Pelvic:  Cervical exam deferred        Extremities: Normal range of motion.  Edema: None  Mental Status: Normal mood and affect. Normal behavior. Normal judgment and thought content.   Assessment and Plan:  Pregnancy: G1P0 at 2241w2d  1. Abnormal weight gain during pregnancy, antepartum     Has had low maternal weight gain this pregnancy, states that she is eating.    2. Supervision of normal first pregnancy, antepartum      Low maternal weight gain, monitor fundal heights closely  Preterm labor symptoms and general obstetric precautions including but not limited to vaginal bleeding, contractions, leaking of fluid and fetal movement were reviewed in detail with the patient. Please refer to  After Visit Summary for other counseling recommendations.  Return in about 2 weeks (around 07/26/2016) for ROB.   Roe Coombsachelle A Denney, CNM

## 2016-07-26 ENCOUNTER — Ambulatory Visit (INDEPENDENT_AMBULATORY_CARE_PROVIDER_SITE_OTHER): Payer: Medicaid Other | Admitting: Certified Nurse Midwife

## 2016-07-26 VITALS — BP 132/81 | HR 93 | Temp 98.1°F | Wt 162.1 lb

## 2016-07-26 DIAGNOSIS — O2600 Excessive weight gain in pregnancy, unspecified trimester: Secondary | ICD-10-CM

## 2016-07-26 DIAGNOSIS — Z3400 Encounter for supervision of normal first pregnancy, unspecified trimester: Secondary | ICD-10-CM

## 2016-07-26 DIAGNOSIS — O2603 Excessive weight gain in pregnancy, third trimester: Secondary | ICD-10-CM

## 2016-07-26 NOTE — Progress Notes (Signed)
Patient states that she is feeling good today. 

## 2016-07-26 NOTE — Progress Notes (Signed)
PRENATAL VISIT NOTE  Subjective:  Melody Patrick is a 21 y.o. G1P0 at 75w4dbeing seen today for ongoing prenatal care.  She is currently monitored for the following issues for this low-risk pregnancy and has Supervision of normal pregnancy, antepartum and Abnormal weight gain during pregnancy, antepartum on her problem list.  Patient reports no complaints.  Contractions: Not present. Vag. Bleeding: None.  Movement: Present. Denies leaking of fluid.   The following portions of the patient's history were reviewed and updated as appropriate: allergies, current medications, past family history, past medical history, past social history, past surgical history and problem list. Problem list updated.  Objective:   Vitals:   07/26/16 1101  BP: 132/81  Pulse: 93  Temp: 98.1 F (36.7 C)  Weight: 162 lb 1.6 oz (73.5 kg)    Fetal Status: Fetal Heart Rate (bpm): 140 Fundal Height: 32 cm Movement: Present     General:  Alert, oriented and cooperative. Patient is in no acute distress.  Skin: Skin is warm and dry. No rash noted.   Cardiovascular: Normal heart rate noted  Respiratory: Normal respiratory effort, no problems with respiration noted  Abdomen: Soft, gravid, appropriate for gestational age. Pain/Pressure: Absent     Pelvic:  Cervical exam deferred        Extremities: Normal range of motion.  Edema: None  Mental Status: Normal mood and affect. Normal behavior. Normal judgment and thought content.   Assessment and Plan:  Pregnancy: G1P0 at 314w4d1. Supervision of normal first pregnancy, antepartum     Doing well  2. Abnormal weight gain during pregnancy, antepartum     Has gained 6# this pregnancy.  Met with nutrition 07/08/16.  Would consider [place] a few weeks.    Preterm labor symptoms and general obstetric precautions including but not limited to vaginal bleeding, contractions, leaking of fluid and fetal movement were reviewed in detail with the patient. Please refer to  After Visit Summary for other counseling recommendations.  Return in about 2 weeks (around 08/09/2016) for ROB.   RaMorene CrockerCNM

## 2016-07-26 NOTE — Patient Instructions (Signed)
AREA PEDIATRIC/FAMILY PRACTICE PHYSICIANS  [facility] [address], [address] [place], [place]  [address] Phone - [phone]   Fax - [phone]  [facility] [address] [place], [place] [address] Phone - [phone]   Fax - [phone]  [facility] [address] [place], [place]  [address] Phone - [phone]   Fax - [phone]  [facility] [address] [place], [place]  [address] Phone - [phone]   Fax - [phone]  [facility] [address] [place], [place]  [address] Phone - [phone]   Fax - [phone]  [facility] [address], [address] [place], [place]  [address] Phone - [phone]   Fax - [phone]  [facility] [address] [place], [place]  [address] Phone - [phone]   Fax - [phone]  [facility] [address] [place], [place]  [address] Phone - [phone]   Fax - [phone]  [facility] [address] [place], [place]  [address] Phone - [phone]   Fax - [phone] [facility] [address] [place], [place]  [address] Phone - [phone]   Fax - [phone]  [facility] [address] [place], [place]  [address] Phone - [phone]   Fax - [phone]  [facility] [address] [place], [place]  [address] Phone - [phone]   Fax - [phone]  [facility] [address], [address] [place], [place]  [address] Phone - [phone]   Fax - [phone]  [facility] [address] [place], [place]  [address] Phone - [phone]  [facility] [address] [place], [place]  [address] Phone - [phone]   Fax - [phone]  [facility] [address] [place], [place]  [address] Phone - [phone]   Fax - [phone]  [facility] [address] [place], [place]  [address] Phone - [phone]   Fax - [phone]  [facility] [address] [place], [place]  [address] Phone - [phone]   Fax - [phone]   [facility] [address] [place], [place]  [address] Phone - [phone]   Fax - [phone]  [facility] [address] [place], [place]  [address] Phone - [phone]   Fax - [phone]  [facility] [address] [place], [place]  [address] Phone - [phone]   Fax - [phone]  [facility] [address] , [place]  [address] Phone - [phone]   Fax - [phone]  [facility] [address] , [place]  [address] Phone - [phone]   Fax - [phone] [facility] [address] [place], [place]  [address] Phone - [phone]   Fax - [phone]  [facility] [address] [place], [place]  [address] Phone - [phone]   Fax - [phone]  [facility] [address] [place],   [address] Phone - [phone]   Fax - [phone]   

## 2016-08-09 ENCOUNTER — Ambulatory Visit (INDEPENDENT_AMBULATORY_CARE_PROVIDER_SITE_OTHER): Payer: Medicaid Other | Admitting: Certified Nurse Midwife

## 2016-08-09 VITALS — BP 114/75 | HR 98

## 2016-08-09 DIAGNOSIS — Z3400 Encounter for supervision of normal first pregnancy, unspecified trimester: Secondary | ICD-10-CM

## 2016-08-09 DIAGNOSIS — O2600 Excessive weight gain in pregnancy, unspecified trimester: Secondary | ICD-10-CM

## 2016-08-09 DIAGNOSIS — O2603 Excessive weight gain in pregnancy, third trimester: Secondary | ICD-10-CM

## 2016-08-09 NOTE — Progress Notes (Signed)
   PRENATAL VISIT NOTE  Subjective:  Melody Patrick is a 21 y.o. G1P0 at 7102w4d being seen today for ongoing prenatal care.  She is currently monitored for the following issues for this low-risk pregnancy and has Supervision of normal pregnancy, antepartum and Abnormal weight gain during pregnancy, antepartum on her problem list.  Patient reports no complaints.  Contractions: Not present. Vag. Bleeding: None.  Movement: Present. Denies leaking of fluid.   The following portions of the patient's history were reviewed and updated as appropriate: allergies, current medications, past family history, past medical history, past social history, past surgical history and problem list. Problem list updated.  Objective:   Vitals:   08/09/16 1108  BP: 114/75  Pulse: 98    Fetal Status: Fetal Heart Rate (bpm): 147 Fundal Height: 34 cm Movement: Present     General:  Alert, oriented and cooperative. Patient is in no acute distress.  Skin: Skin is warm and dry. No rash noted.   Cardiovascular: Normal heart rate noted  Respiratory: Normal respiratory effort, no problems with respiration noted  Abdomen: Soft, gravid, appropriate for gestational age. Pain/Pressure: Absent     Pelvic:  Cervical exam deferred        Extremities: Normal range of motion.     Mental Status: Normal mood and affect. Normal behavior. Normal judgment and thought content.   Assessment and Plan:  Pregnancy: G1P0 at 18102w4d  1. Supervision of normal first pregnancy, antepartum     Doing well,  US ordered for low maternal weight gain.   2. Abnormal weight gain during pregnancy, antepartum     Nutrition consult completed: 07/08/16.  TWG this pregnancy: 6#2oz  Preterm labor symptoms and general obstetric precautions including but not limited to vaginal bleeding, contractions, leaking of fluid and fetal movement were reviewed in detail with the patient. Please refer to After Visit Summary for other counseling recommendations.    No Follow-up on file.   Roe Coombsachelle A Tykeisha Peer, CNM

## 2016-08-09 NOTE — Patient Instructions (Signed)
AREA PEDIATRIC/FAMILY PRACTICE PHYSICIANS  [facility] [address] [place], [place]  [address] Phone - [phone]   Fax - [phone]  [facility] [address] [place], [place] [address] Phone - [phone]   Fax - [phone]  [facility] [address] [place], [place]  [address] Phone - [phone]   Fax - [phone]  [facility] [address] [place], [place]  [address] Phone - [phone]   Fax - [phone]  [facility] [address] [place], [place]  [address] Phone - [phone]   Fax - [phone]  [facility] [address] [place], [place]  [address] Phone - [phone]   Fax - [phone]  [facility] [address] [place], [place]  [address] Phone - [phone]   Fax - [phone]  [facility] [address] [place], [place]  [address] Phone - [phone]   Fax - [phone]  [facility] [address] [place], [place]  [address] Phone - [phone]   Fax - [phone] [facility] [address] [place], [place]  [address] Phone - [phone]   Fax - [phone]  [facility] [address] [place], [place]  [address] Phone - [phone]   Fax - [phone]  [facility] [address] [place], [place]  [address] Phone - [phone]   Fax - [phone]  [facility] [address] [place], [place]  [address] Phone - [phone]   Fax - [phone]  [facility] [address] [place], [place]  [address] Phone - [phone]  [facility] [address] [place], [place]  [address] Phone - [phone]   Fax - [phone]  [facility] [address] [place], [place]  [address] Phone - [phone]   Fax - [phone]  [facility] [address] [place], [place]  [address] Phone - [phone]   Fax - [phone]  [facility] [address] [place], [place]  [address] Phone - [phone]   Fax - [phone]   [facility] [address] [place], [place]  [address] Phone - [phone]   Fax - [phone]  [facility] [address] [place], [place]  [address] Phone - [phone]   Fax - [phone]  [facility] [address] [place], [place]  [address] Phone - [phone]   Fax - [phone]  [facility] [address] [place], [place]  [address] Phone - [phone]   Fax - [phone]  [facility] [address] , [place]  [address] Phone - [phone]   Fax - [phone] [facility] [address] [place], [place]  [address] Phone - [phone]   Fax - [phone]  [facility] [address] [place], [place]  [address] Phone - [phone]   Fax - [phone]   [facility] [address] [place],   [address] Phone - [phone]   Fax - [phone]   

## 2016-08-16 ENCOUNTER — Ambulatory Visit (HOSPITAL_COMMUNITY): Payer: Medicaid Other

## 2016-08-19 ENCOUNTER — Other Ambulatory Visit (HOSPITAL_COMMUNITY)
Admission: RE | Admit: 2016-08-19 | Discharge: 2016-08-19 | Disposition: A | Discharge status: Home / Self Care | Payer: Medicaid Other | Source: Ambulatory Visit | Attending: Certified Nurse Midwife | Admitting: Certified Nurse Midwife

## 2016-08-19 ENCOUNTER — Ambulatory Visit (INDEPENDENT_AMBULATORY_CARE_PROVIDER_SITE_OTHER): Payer: Medicaid Other | Admitting: Certified Nurse Midwife

## 2016-08-19 ENCOUNTER — Encounter: Payer: Self-pay | Admitting: Certified Nurse Midwife

## 2016-08-19 VITALS — BP 127/78 | HR 108 | Wt 166.6 lb

## 2016-08-19 DIAGNOSIS — Z3400 Encounter for supervision of normal first pregnancy, unspecified trimester: Secondary | ICD-10-CM | POA: Insufficient documentation

## 2016-08-19 DIAGNOSIS — O2603 Excessive weight gain in pregnancy, third trimester: Secondary | ICD-10-CM

## 2016-08-19 DIAGNOSIS — O2600 Excessive weight gain in pregnancy, unspecified trimester: Secondary | ICD-10-CM

## 2016-08-19 LAB — OB RESULTS CONSOLE GC/CHLAMYDIA: GC PROBE AMP, GENITAL: NEGATIVE

## 2016-08-19 NOTE — Progress Notes (Signed)
   PRENATAL VISIT NOTE  Subjective:  Melody Patrick is a 21 y.o. G1P0 at 332w0d being seen today for ongoing prenatal care.  She is currently monitored for the following issues for this low-risk pregnancy and has Supervision of normal pregnancy, antepartum and Abnormal weight gain during pregnancy, antepartum on her problem list.  Patient reports no complaints.  Contractions: Not present. Vag. Bleeding: None.  Movement: Present. Denies leaking of fluid.   The following portions of the patient's history were reviewed and updated as appropriate: allergies, current medications, past family history, past medical history, past social history, past surgical history and problem list. Problem list updated.  Objective:   Vitals:   08/19/16 1516  BP: 127/78  Pulse: (!) 108  Weight: 166 lb 9.6 oz (75.6 kg)    Fetal Status: Fetal Heart Rate (bpm): 154 Fundal Height: 36 cm Movement: Present  Presentation: Vertex  General:  Alert, oriented and cooperative. Patient is in no acute distress.  Skin: Skin is warm and dry. No rash noted.   Cardiovascular: Normal heart rate noted  Respiratory: Normal respiratory effort, no problems with respiration noted  Abdomen: Soft, gravid, appropriate for gestational age. Pain/Pressure: Absent     Pelvic:  Cervical exam performed Dilation: Fingertip Effacement (%): 0 Station: -3  Extremities: Normal range of motion.  Edema: None  Mental Status: Normal mood and affect. Normal behavior. Normal judgment and thought content.   Assessment and Plan:  Pregnancy: G1P0 at 1532w0d  1. Supervision of normal first pregnancy, antepartum     Doing well. - Strep Gp B NAA - Cervicovaginal ancillary only  2. Abnormal weight gain during pregnancy, antepartum     11 lb weight gain this pregnancy.  Snacking encouraged.   Preterm labor symptoms and general obstetric precautions including but not limited to vaginal bleeding, contractions, leaking of fluid and fetal movement were  reviewed in detail with the patient. Please refer to After Visit Summary for other counseling recommendations.  Return in about 1 week (around 08/26/2016) for ROB.   Roe Coombsachelle A Novi Calia, CNM

## 2016-08-19 NOTE — Progress Notes (Signed)
Patient reports she is doing well- she would like a guess as to the weight of her baby.

## 2016-08-20 LAB — CERVICOVAGINAL ANCILLARY ONLY
Bacterial vaginitis: NEGATIVE
Candida vaginitis: NEGATIVE
Chlamydia: NEGATIVE
Neisseria Gonorrhea: NEGATIVE
Trichomonas: NEGATIVE

## 2016-08-21 ENCOUNTER — Other Ambulatory Visit: Payer: Self-pay | Admitting: Certified Nurse Midwife

## 2016-08-21 DIAGNOSIS — O99820 Streptococcus B carrier state complicating pregnancy: Secondary | ICD-10-CM | POA: Insufficient documentation

## 2016-08-21 LAB — STREP GP B NAA: STREP GROUP B AG: POSITIVE — AB

## 2016-08-23 ENCOUNTER — Other Ambulatory Visit: Payer: Self-pay | Admitting: Certified Nurse Midwife

## 2016-08-23 ENCOUNTER — Ambulatory Visit (HOSPITAL_COMMUNITY)
Admission: RE | Admit: 2016-08-23 | Discharge: 2016-08-23 | Disposition: A | Discharge status: Home / Self Care | Payer: Medicaid Other | Source: Ambulatory Visit | Attending: Certified Nurse Midwife | Admitting: Certified Nurse Midwife

## 2016-08-23 DIAGNOSIS — Z3689 Encounter for other specified antenatal screening: Secondary | ICD-10-CM | POA: Diagnosis present

## 2016-08-23 DIAGNOSIS — O2600 Excessive weight gain in pregnancy, unspecified trimester: Secondary | ICD-10-CM

## 2016-08-23 DIAGNOSIS — Z3400 Encounter for supervision of normal first pregnancy, unspecified trimester: Secondary | ICD-10-CM

## 2016-08-23 DIAGNOSIS — Z3A36 36 weeks gestation of pregnancy: Secondary | ICD-10-CM | POA: Insufficient documentation

## 2016-08-23 DIAGNOSIS — O2613 Low weight gain in pregnancy, third trimester: Secondary | ICD-10-CM | POA: Insufficient documentation

## 2016-08-23 NOTE — Addendum Note (Signed)
Encounter addended by: Drue NovelAlyssa J Jamira Barfuss, RDMS on: 08/23/2016  2:18 PM<BR>    Actions taken: Imaging Exam ended

## 2016-08-26 ENCOUNTER — Other Ambulatory Visit: Payer: Self-pay | Admitting: Certified Nurse Midwife

## 2016-08-27 ENCOUNTER — Encounter: Payer: Self-pay | Admitting: Certified Nurse Midwife

## 2016-08-27 ENCOUNTER — Ambulatory Visit (INDEPENDENT_AMBULATORY_CARE_PROVIDER_SITE_OTHER): Payer: Medicaid Other | Admitting: Certified Nurse Midwife

## 2016-08-27 VITALS — BP 117/76 | HR 70 | Wt 168.2 lb

## 2016-08-27 DIAGNOSIS — O365930 Maternal care for other known or suspected poor fetal growth, third trimester, not applicable or unspecified: Secondary | ICD-10-CM

## 2016-08-27 DIAGNOSIS — O2600 Excessive weight gain in pregnancy, unspecified trimester: Secondary | ICD-10-CM

## 2016-08-27 DIAGNOSIS — Z3403 Encounter for supervision of normal first pregnancy, third trimester: Secondary | ICD-10-CM

## 2016-08-27 DIAGNOSIS — O99820 Streptococcus B carrier state complicating pregnancy: Secondary | ICD-10-CM

## 2016-08-27 DIAGNOSIS — Z3400 Encounter for supervision of normal first pregnancy, unspecified trimester: Secondary | ICD-10-CM

## 2016-08-27 DIAGNOSIS — O2603 Excessive weight gain in pregnancy, third trimester: Secondary | ICD-10-CM

## 2016-08-27 NOTE — Progress Notes (Signed)
Patient is in the office, reports good fetal movement. 

## 2016-08-27 NOTE — Progress Notes (Signed)
   PRENATAL VISIT NOTE  Subjective:  Melody Patrick is a 21 y.o. G1P0 at 6643w1d being seen today for ongoing prenatal care.  She is currently monitored for the following issues for this low-risk pregnancy and has Supervision of normal pregnancy, antepartum; Abnormal weight gain during pregnancy, antepartum; GBS (group B Streptococcus carrier), +RV culture, currently pregnant; and Small fetus on her problem list.  Patient reports no complaints.  Contractions: Not present. Vag. Bleeding: None.  Movement: Present. Denies leaking of fluid.   The following portions of the patient's history were reviewed and updated as appropriate: allergies, current medications, past family history, past medical history, past social history, past surgical history and problem list. Problem list updated.  Objective:   Vitals:   08/27/16 1115  BP: 117/76  Pulse: 70  Weight: 168 lb 3.2 oz (76.3 kg)    Fetal Status: Fetal Heart Rate (bpm): 145 Fundal Height: 35 cm Movement: Present  Presentation: Vertex  General:  Alert, oriented and cooperative. Patient is in no acute distress.  Skin: Skin is warm and dry. No rash noted.   Cardiovascular: Normal heart rate noted  Respiratory: Normal respiratory effort, no problems with respiration noted  Abdomen: Soft, gravid, appropriate for gestational age. Pain/Pressure: Absent     Pelvic:  Cervical exam performed Dilation: 1 Effacement (%): 0 Station: -3  Extremities: Normal range of motion.  Edema: None  Mental Status: Normal mood and affect. Normal behavior. Normal judgment and thought content.   Assessment and Plan:  Pregnancy: G1P0 at 2743w1d  1. Supervision of normal first pregnancy, antepartum     Doing well  2. GBS (group B Streptococcus carrier), +RV culture, currently pregnant      PCN for delivery  3. Abnormal weight gain during pregnancy, antepartum     12 lb weight gain this pregnancy  4. Small fetus      Per MFM: If not delivered by 39 weeks,  consider delivery 39-40 weeks.  EFW:14%  Term labor symptoms and general obstetric precautions including but not limited to vaginal bleeding, contractions, leaking of fluid and fetal movement were reviewed in detail with the patient. Please refer to After Visit Summary for other counseling recommendations.  Return in about 1 week (around 09/03/2016) for ROB.   Roe Coombsachelle A Denney, CNM

## 2016-08-28 ENCOUNTER — Telehealth: Payer: Self-pay | Admitting: *Deleted

## 2016-08-28 NOTE — Telephone Encounter (Signed)
Patient states she had her cervix checked yesterday and now she is having a mucus discharge- is that normal. Told patient it could be related to her mucus plug- it can come out in clumps or in one big piece. It is an indicator that the cervix is going through changes- but not an indictor of when labor is going to happen. Just be aware of body and contractions. Patient understands.

## 2016-09-03 ENCOUNTER — Ambulatory Visit (INDEPENDENT_AMBULATORY_CARE_PROVIDER_SITE_OTHER): Payer: Medicaid Other | Admitting: Certified Nurse Midwife

## 2016-09-03 VITALS — BP 121/79 | HR 96 | Wt 171.0 lb

## 2016-09-03 DIAGNOSIS — O2603 Excessive weight gain in pregnancy, third trimester: Secondary | ICD-10-CM

## 2016-09-03 DIAGNOSIS — Z3400 Encounter for supervision of normal first pregnancy, unspecified trimester: Secondary | ICD-10-CM

## 2016-09-03 DIAGNOSIS — O99820 Streptococcus B carrier state complicating pregnancy: Secondary | ICD-10-CM

## 2016-09-03 DIAGNOSIS — O2600 Excessive weight gain in pregnancy, unspecified trimester: Secondary | ICD-10-CM

## 2016-09-03 DIAGNOSIS — O365930 Maternal care for other known or suspected poor fetal growth, third trimester, not applicable or unspecified: Secondary | ICD-10-CM

## 2016-09-03 NOTE — Progress Notes (Signed)
   PRENATAL VISIT NOTE  Subjective:  Redmond Basemanatyana L Heinlein is a 21 y.o. G1P0 at 3754w1d being seen today for ongoing prenatal care.  She is currently monitored for the following issues for this low-risk pregnancy and has Supervision of normal pregnancy, antepartum; Abnormal weight gain during pregnancy, antepartum; GBS (group B Streptococcus carrier), +RV culture, currently pregnant; and Small fetus on her problem list.  Patient reports no complaints.  Contractions: Not present. Vag. Bleeding: None.  Movement: Present. Denies leaking of fluid.   The following portions of the patient's history were reviewed and updated as appropriate: allergies, current medications, past family history, past medical history, past social history, past surgical history and problem list. Problem list updated.  Objective:   Vitals:   09/03/16 1417  BP: 121/79  Pulse: 96  Weight: 171 lb (77.6 kg)    Fetal Status: Fetal Heart Rate (bpm): 155 Fundal Height: 37 cm Movement: Present     General:  Alert, oriented and cooperative. Patient is in no acute distress.  Skin: Skin is warm and dry. No rash noted.   Cardiovascular: Normal heart rate noted  Respiratory: Normal respiratory effort, no problems with respiration noted  Abdomen: Soft, gravid, appropriate for gestational age. Pain/Pressure: Absent     Pelvic:  Cervical exam deferred        Extremities: Normal range of motion.  Edema: None  Mental Status: Normal mood and affect. Normal behavior. Normal judgment and thought content.   Assessment and Plan:  Pregnancy: G1P0 at 5154w1d  1. Supervision of normal first pregnancy, antepartum        2. Small fetus      Discussed with Dr. Debroah LoopArnold, no testing right now is indicated, discuss possible IOL after 39 week US.   - US MFM OB FOLLOW UP; Future  3. GBS (group B Streptococcus carrier), +RV culture, currently pregnant     PCN for delivery  4. Abnormal weight gain during pregnancy, antepartum     15lb weight gain  this pregnancy  Term labor symptoms and general obstetric precautions including but not limited to vaginal bleeding, contractions, leaking of fluid and fetal movement were reviewed in detail with the patient. Please refer to After Visit Summary for other counseling recommendations.  Return in about 1 week (around 09/10/2016) for ROB, after US.   Roe Coombsachelle A Paizlie Klaus, CNM

## 2016-09-03 NOTE — Progress Notes (Signed)
Patient reports good fetal movement, denies contractions. 

## 2016-09-05 ENCOUNTER — Other Ambulatory Visit: Payer: Self-pay | Admitting: Certified Nurse Midwife

## 2016-09-09 ENCOUNTER — Ambulatory Visit (HOSPITAL_COMMUNITY)
Admission: RE | Admit: 2016-09-09 | Discharge: 2016-09-09 | Disposition: A | Discharge status: Home / Self Care | Payer: Medicaid Other | Source: Ambulatory Visit | Attending: Certified Nurse Midwife | Admitting: Certified Nurse Midwife

## 2016-09-09 ENCOUNTER — Other Ambulatory Visit: Payer: Self-pay | Admitting: Certified Nurse Midwife

## 2016-09-09 DIAGNOSIS — O365930 Maternal care for other known or suspected poor fetal growth, third trimester, not applicable or unspecified: Secondary | ICD-10-CM

## 2016-09-09 DIAGNOSIS — Z3A39 39 weeks gestation of pregnancy: Secondary | ICD-10-CM

## 2016-09-09 DIAGNOSIS — O2613 Low weight gain in pregnancy, third trimester: Secondary | ICD-10-CM | POA: Insufficient documentation

## 2016-09-10 ENCOUNTER — Inpatient Hospital Stay (HOSPITAL_COMMUNITY): Payer: Medicaid Other | Admitting: Anesthesiology

## 2016-09-10 ENCOUNTER — Ambulatory Visit (INDEPENDENT_AMBULATORY_CARE_PROVIDER_SITE_OTHER): Payer: Medicaid Other | Admitting: Certified Nurse Midwife

## 2016-09-10 ENCOUNTER — Inpatient Hospital Stay (HOSPITAL_COMMUNITY)
Admission: AD | Admit: 2016-09-10 | Discharge: 2016-09-13 | DRG: 775 | Disposition: A | Discharge status: Home / Self Care | Payer: Medicaid Other | Source: Ambulatory Visit | Attending: Family Medicine | Admitting: Family Medicine

## 2016-09-10 ENCOUNTER — Inpatient Hospital Stay (EMERGENCY_DEPARTMENT_HOSPITAL)
Admission: AD | Admit: 2016-09-10 | Discharge: 2016-09-10 | Disposition: A | Discharge status: Home / Self Care | Payer: Medicaid Other | Source: Ambulatory Visit | Attending: Family Medicine | Admitting: Family Medicine

## 2016-09-10 ENCOUNTER — Encounter (HOSPITAL_COMMUNITY): Payer: Self-pay

## 2016-09-10 ENCOUNTER — Encounter (HOSPITAL_COMMUNITY): Payer: Self-pay | Admitting: *Deleted

## 2016-09-10 ENCOUNTER — Ambulatory Visit (HOSPITAL_COMMUNITY): Admission: RE | Admit: 2016-09-10 | Payer: Medicaid Other | Source: Ambulatory Visit

## 2016-09-10 ENCOUNTER — Encounter: Payer: Self-pay | Admitting: Certified Nurse Midwife

## 2016-09-10 ENCOUNTER — Telehealth (HOSPITAL_COMMUNITY): Payer: Self-pay | Admitting: *Deleted

## 2016-09-10 VITALS — BP 129/87 | HR 88 | Temp 97.9°F | Wt 170.8 lb

## 2016-09-10 DIAGNOSIS — O134 Gestational [pregnancy-induced] hypertension without significant proteinuria, complicating childbirth: Principal | ICD-10-CM | POA: Diagnosis present

## 2016-09-10 DIAGNOSIS — Z3A39 39 weeks gestation of pregnancy: Secondary | ICD-10-CM

## 2016-09-10 DIAGNOSIS — Z8249 Family history of ischemic heart disease and other diseases of the circulatory system: Secondary | ICD-10-CM

## 2016-09-10 DIAGNOSIS — D649 Anemia, unspecified: Secondary | ICD-10-CM | POA: Diagnosis present

## 2016-09-10 DIAGNOSIS — O99824 Streptococcus B carrier state complicating childbirth: Secondary | ICD-10-CM | POA: Diagnosis present

## 2016-09-10 DIAGNOSIS — O365930 Maternal care for other known or suspected poor fetal growth, third trimester, not applicable or unspecified: Secondary | ICD-10-CM

## 2016-09-10 DIAGNOSIS — O9902 Anemia complicating childbirth: Secondary | ICD-10-CM | POA: Diagnosis present

## 2016-09-10 DIAGNOSIS — O99214 Obesity complicating childbirth: Secondary | ICD-10-CM | POA: Diagnosis present

## 2016-09-10 DIAGNOSIS — Z3689 Encounter for other specified antenatal screening: Secondary | ICD-10-CM | POA: Diagnosis not present

## 2016-09-10 DIAGNOSIS — Z87891 Personal history of nicotine dependence: Secondary | ICD-10-CM | POA: Insufficient documentation

## 2016-09-10 DIAGNOSIS — O365990 Maternal care for other known or suspected poor fetal growth, unspecified trimester, not applicable or unspecified: Secondary | ICD-10-CM | POA: Insufficient documentation

## 2016-09-10 DIAGNOSIS — Z3493 Encounter for supervision of normal pregnancy, unspecified, third trimester: Secondary | ICD-10-CM | POA: Diagnosis present

## 2016-09-10 DIAGNOSIS — E669 Obesity, unspecified: Secondary | ICD-10-CM | POA: Diagnosis present

## 2016-09-10 DIAGNOSIS — Z3400 Encounter for supervision of normal first pregnancy, unspecified trimester: Secondary | ICD-10-CM

## 2016-09-10 LAB — URINALYSIS, ROUTINE W REFLEX MICROSCOPIC
Bilirubin Urine: NEGATIVE
Glucose, UA: NEGATIVE mg/dL
KETONES UR: NEGATIVE mg/dL
Nitrite: NEGATIVE
PROTEIN: NEGATIVE mg/dL
Specific Gravity, Urine: 1.012 (ref 1.005–1.030)
pH: 7 (ref 5.0–8.0)

## 2016-09-10 LAB — COMPREHENSIVE METABOLIC PANEL
ALBUMIN: 3.2 g/dL — AB (ref 3.5–5.0)
ALK PHOS: 154 U/L — AB (ref 38–126)
ALT: 10 U/L — AB (ref 14–54)
ANION GAP: 11 (ref 5–15)
AST: 18 U/L (ref 15–41)
BUN: 6 mg/dL (ref 6–20)
CO2: 17 mmol/L — AB (ref 22–32)
CREATININE: 0.5 mg/dL (ref 0.44–1.00)
Calcium: 8.4 mg/dL — ABNORMAL LOW (ref 8.9–10.3)
Chloride: 105 mmol/L (ref 101–111)
GFR calc Af Amer: >60 mL/min (ref >60)
GFR calc non Af Amer: >60 mL/min (ref >60)
GLUCOSE: 93 mg/dL (ref 65–99)
Potassium: 3.8 mmol/L (ref 3.5–5.1)
SODIUM: 133 mmol/L — AB (ref 135–145)
Total Bilirubin: 0.6 mg/dL (ref 0.3–1.2)
Total Protein: 7.2 g/dL (ref 6.5–8.1)

## 2016-09-10 LAB — TYPE AND SCREEN
ABO/RH(D): O POS
ANTIBODY SCREEN: NEGATIVE

## 2016-09-10 LAB — PROTEIN / CREATININE RATIO, URINE
Creatinine, Urine: 102 mg/dL
PROTEIN CREATININE RATIO: 0.17 mg/mg{creat} — AB (ref 0.00–0.15)
Total Protein, Urine: 17 mg/dL

## 2016-09-10 LAB — CBC
HCT: 31.1 % — ABNORMAL LOW (ref 36.0–46.0)
HEMOGLOBIN: 10.3 g/dL — AB (ref 12.0–15.0)
MCH: 25.2 pg — ABNORMAL LOW (ref 26.0–34.0)
MCHC: 33.1 g/dL (ref 30.0–36.0)
MCV: 76.2 fL — ABNORMAL LOW (ref 78.0–100.0)
Platelets: 376 10*3/uL (ref 150–400)
RBC: 4.08 MIL/uL (ref 3.87–5.11)
RDW: 15.1 % (ref 11.5–15.5)
WBC: 16 10*3/uL — AB (ref 4.0–10.5)

## 2016-09-10 MED ORDER — FLEET ENEMA 7-19 GM/118ML RE ENEM: 1.0000 | ENEMA | RECTAL | Status: DC | PRN

## 2016-09-10 MED ORDER — FENTANYL 2.5 MCG/ML BUPIVACAINE 1/10 % EPIDURAL INFUSION (WH - ANES)
INTRAMUSCULAR | Status: AC
  Filled 2016-09-10: qty 100

## 2016-09-10 MED ORDER — PENICILLIN G POTASSIUM 5000000 UNITS IJ SOLR
5.0000 10*6.[IU] | Freq: Once | INTRAVENOUS | Status: AC
  Administered 2016-09-10: 5 10*6.[IU] via INTRAVENOUS
  Filled 2016-09-10: qty 5

## 2016-09-10 MED ORDER — PENICILLIN G POT IN DEXTROSE 60000 UNIT/ML IV SOLN
3.0000 10*6.[IU] | INTRAVENOUS | Status: DC
  Filled 2016-09-10 (×3): qty 50

## 2016-09-10 MED ORDER — ACETAMINOPHEN 325 MG PO TABS: 650.0000 mg | ORAL_TABLET | ORAL | Status: DC | PRN

## 2016-09-10 MED ORDER — LACTATED RINGERS IV SOLN
INTRAVENOUS | Status: DC
  Administered 2016-09-10: 23:00:00 via INTRAVENOUS

## 2016-09-10 MED ORDER — SOD CITRATE-CITRIC ACID 500-334 MG/5ML PO SOLN: 30.0000 mL | ORAL | Status: DC | PRN

## 2016-09-10 MED ORDER — LACTATED RINGERS IV SOLN: 500.0000 mL | INTRAVENOUS | Status: DC | PRN

## 2016-09-10 MED ORDER — ONDANSETRON HCL 4 MG/2ML IJ SOLN: 4.0000 mg | Freq: Four times a day (QID) | INTRAMUSCULAR | Status: DC | PRN

## 2016-09-10 MED ORDER — FENTANYL 2.5 MCG/ML BUPIVACAINE 1/10 % EPIDURAL INFUSION (WH - ANES)
INTRAMUSCULAR | Status: DC | PRN
  Administered 2016-09-10: 14 mL/h via EPIDURAL

## 2016-09-10 MED ORDER — OXYTOCIN BOLUS FROM INFUSION
500.0000 mL | Freq: Once | INTRAVENOUS | Status: AC
  Administered 2016-09-11: 500 mL via INTRAVENOUS

## 2016-09-10 MED ORDER — LACTATED RINGERS IV SOLN
500.0000 mL | Freq: Once | INTRAVENOUS | Status: AC
  Administered 2016-09-10: 500 mL via INTRAVENOUS

## 2016-09-10 MED ORDER — LIDOCAINE HCL (PF) 1 % IJ SOLN
INTRAMUSCULAR | Status: DC | PRN
  Administered 2016-09-10: 2 mL via EPIDURAL
  Administered 2016-09-10: 3 mL via EPIDURAL
  Administered 2016-09-10: 5 mL via EPIDURAL

## 2016-09-10 MED ORDER — OXYTOCIN 40 UNITS IN LACTATED RINGERS INFUSION - SIMPLE MED
2.5000 [IU]/h | INTRAVENOUS | Status: DC
  Administered 2016-09-11: 2.5 [IU]/h via INTRAVENOUS
  Filled 2016-09-10: qty 1000

## 2016-09-10 MED ORDER — OXYCODONE-ACETAMINOPHEN 5-325 MG PO TABS: 2.0000 | ORAL_TABLET | ORAL | Status: DC | PRN

## 2016-09-10 MED ORDER — LIDOCAINE HCL (PF) 1 % IJ SOLN
30.0000 mL | INTRAMUSCULAR | Status: DC | PRN
  Filled 2016-09-10: qty 30

## 2016-09-10 MED ORDER — PHENYLEPHRINE 40 MCG/ML (10ML) SYRINGE FOR IV PUSH (FOR BLOOD PRESSURE SUPPORT)
PREFILLED_SYRINGE | INTRAVENOUS | Status: AC
  Filled 2016-09-10: qty 20

## 2016-09-10 MED ORDER — OXYCODONE-ACETAMINOPHEN 5-325 MG PO TABS: 1.0000 | ORAL_TABLET | ORAL | Status: DC | PRN

## 2016-09-10 NOTE — MAU Note (Signed)
PT SAYS WAS IN OFFICE  TODAY-  VE  2-3  CM  FEELS UC -  5 MIN APART .    DENIES HSV AND MRSA.  GBS- POSITITVE

## 2016-09-10 NOTE — Progress Notes (Signed)
Recheck patient in one hour. 

## 2016-09-10 NOTE — Progress Notes (Signed)
I have communicated with Donette LarryMelanie Bhambri, CNM and reviewed vital signs:  Vitals:   09/10/16 1325 09/10/16 1458  BP: 133/84 130/87  Pulse: 88 84  Resp: 16 16  Temp: 97.8 F (36.6 C)     Vaginal exam:  Dilation: 2.5 Effacement (%): 90 Cervical Position: Middle Station: -3 Presentation: Vertex Exam by:: Ginger Morris, RN,   Also reviewed contraction pattern and that non-stress test is reactive.  It has been documented that patient is contracting every  2-7 minutes with no cervical change over 1 hour not indicating active labor.  Patient denies any other complaints.  Based on this report provider has given order for discharge.  A discharge order and diagnosis entered by a provider.   Labor discharge instructions reviewed with patient.

## 2016-09-10 NOTE — Anesthesia Preprocedure Evaluation (Signed)
Anesthesia Evaluation  Patient identified by MRN, date of birth, ID band Patient awake    Reviewed: Allergy & Precautions, NPO status , Patient's Chart, lab work & pertinent test results  Airway Mallampati: II  TM Distance: >3 FB Neck ROM: Full    Dental  (+) Teeth Intact, Dental Advisory Given, Chipped,    Pulmonary neg pulmonary ROS, former smoker,    Pulmonary exam normal breath sounds clear to auscultation       Cardiovascular negative cardio ROS Normal cardiovascular exam Rhythm:Regular Rate:Normal     Neuro/Psych negative neurological ROS     GI/Hepatic negative GI ROS, Neg liver ROS,   Endo/Other  negative endocrine ROSObesity   Renal/GU negative Renal ROS     Musculoskeletal negative musculoskeletal ROS (+)   Abdominal   Peds  Hematology negative hematology ROS (+) Blood dyscrasia, anemia , Plt 376k   Anesthesia Other Findings Day of surgery medications reviewed with the patient.  Reproductive/Obstetrics (+) Pregnancy                             Anesthesia Physical Anesthesia Plan  ASA: II  Anesthesia Plan: Epidural   Post-op Pain Management:    Induction:   Airway Management Planned:   Additional Equipment:   Intra-op Plan:   Post-operative Plan:   Informed Consent: I have reviewed the patients History and Physical, chart, labs and discussed the procedure including the risks, benefits and alternatives for the proposed anesthesia with the patient or authorized representative who has indicated his/her understanding and acceptance.   Dental advisory given  Plan Discussed with:   Anesthesia Plan Comments: (Patient identified. Risks/Benefits/Options discussed with patient including but not limited to bleeding, infection, nerve damage, paralysis, failed block, incomplete pain control, headache, blood pressure changes, nausea, vomiting, reactions to medication both or  allergic, itching and postpartum back pain. Confirmed with bedside nurse the patient's most recent platelet count. Confirmed with patient that they are not currently taking any anticoagulation, have any bleeding history or any family history of bleeding disorders. Patient expressed understanding and wished to proceed. All questions were answered. )        Anesthesia Quick Evaluation

## 2016-09-10 NOTE — MAU Note (Signed)
Report off to Ashley, RN

## 2016-09-10 NOTE — Progress Notes (Signed)
Patient states that she has been having contractions about every 20 minutes since this morning, reports good fetal movement.

## 2016-09-10 NOTE — Progress Notes (Signed)
Recheck patient in one hour.

## 2016-09-10 NOTE — MAU Note (Signed)
Pt. Present with c/o contractions that started around 1600 - with contractions coming every 10 minutes.  Contractions are now coming every 3-4 minutes.  Positive for fetal movement, denies sudden gush of fluid, denies vaginal bleeding, last sexual encounter was two weeks ago. EFM applied - FHR 130s, toco applied - abd soft.

## 2016-09-10 NOTE — Progress Notes (Signed)
   PRENATAL VISIT NOTE  Subjective:  Melody Patrick is a 21 y.o. G1P0 at 2719w1d being seen today for ongoing prenatal care.  She is currently monitored for the following issues for this high-risk pregnancy and has Supervision of normal pregnancy, antepartum; Abnormal weight gain during pregnancy, antepartum; GBS (group B Streptococcus carrier), +RV culture, currently pregnant; Small fetus; and IUGR (intrauterine growth restriction) affecting care of mother on her problem list.  Patient reports no complaints.  Contractions: Irregular. Vag. Bleeding: None.  Movement: Present. Denies leaking of fluid.   The following portions of the patient's history were reviewed and updated as appropriate: allergies, current medications, past family history, past medical history, past social history, past surgical history and problem list. Problem list updated.  Objective:   Vitals:   09/10/16 1024  BP: 129/87  Pulse: 88  Temp: 97.9 F (36.6 C)  Weight: 170 lb 12.8 oz (77.5 kg)    Fetal Status: Fetal Heart Rate (bpm): 150 Fundal Height: 37 cm Movement: Present  Presentation: Vertex  General:  Alert, oriented and cooperative. Patient is in no acute distress.  Skin: Skin is warm and dry. No rash noted.   Cardiovascular: Normal heart rate noted  Respiratory: Normal respiratory effort, no problems with respiration noted  Abdomen: Soft, gravid, appropriate for gestational age. Pain/Pressure: Absent     Pelvic:  Cervical exam performed Dilation: 1.5 Effacement (%): 50 Station: -3  Extremities: Normal range of motion.  Edema: None  Mental Status: Normal mood and affect. Normal behavior. Normal judgment and thought content.  NST: no accels, no decels, moderate variability, Cat. 2 tracing. Contractions on toco every 3-4 minutes.   Assessment and Plan:  Pregnancy: G1P0 at 3619w1d  1. Supervision of normal first pregnancy, antepartum      IUGR dx on US 09/09/16.   Discussed POC and IOL with Dr. Debroah LoopArnold.  Ok  for IOL on Friday.  2. Poor fetal growth affecting management of mother in third trimester, single or unspecified fetus     Non-reactive NST.  Contractions at term.  - Fetal nonstress test; Future  Term labor symptoms and general obstetric precautions including but not limited to vaginal bleeding, contractions, leaking of fluid and fetal movement were reviewed in detail with the patient. Please refer to After Visit Summary for other counseling recommendations.  Return in about 4 weeks (around 10/08/2016) for Postpartum.   Roe Coombsachelle A Denney, CNM

## 2016-09-10 NOTE — Telephone Encounter (Signed)
Preadmission screen  

## 2016-09-10 NOTE — MAU Note (Signed)
[facility]. Denies any VB or LOF

## 2016-09-10 NOTE — Anesthesia Procedure Notes (Signed)
Epidural Patient location during procedure: OB Start time: 09/10/2016 10:30 PM End time: 09/10/2016 10:35 PM  Staffing Anesthesiologist: Cecile HearingURK, Loni Abdon EDWARD Performed: anesthesiologist   Preanesthetic Checklist Completed: patient identified, pre-op evaluation, timeout performed, IV checked, risks and benefits discussed and monitors and equipment checked  Epidural Patient position: sitting Prep: DuraPrep Patient monitoring: blood pressure and continuous pulse ox Approach: midline Location: L3-L4 Injection technique: LOR air  Needle:  Needle type: Tuohy  Needle gauge: 17 G Needle length: 9 cm Needle insertion depth: 6 cm Catheter size: 19 Gauge Catheter at skin depth: 11 cm Test dose: negative and Other (1% Lidocaine)  Additional Notes Patient identified.  Risk benefits discussed including failed block, incomplete pain control, headache, nerve damage, paralysis, blood pressure changes, nausea, vomiting, reactions to medication both toxic or allergic, and postpartum back pain.  Patient expressed understanding and wished to proceed.  All questions were answered.  Sterile technique used throughout procedure and epidural site dressed with sterile barrier dressing. No paresthesia or other complications noted. The patient did not experience any signs of intravascular injection such as tinnitus or metallic taste in mouth nor signs of intrathecal spread such as rapid motor block. Please see nursing notes for vital signs. Reason for block:procedure for pain

## 2016-09-10 NOTE — H&P (Signed)
LABOR AND DELIVERY ADMISSION HISTORY AND PHYSICAL NOTE  Melody Patrick is a 21 y.o. female G1P0 with IUP at [redacted]w[redacted]d by LMP and 15 wk [place] presenting for SOL. Patient endorses contractions since yesterday morning. She was subsequently seen her prenatal follow-up at which point she was told to go to the MAU. Patient without cervical change and discharged, however contractions worsens in severity and became more frequent and regular every 3-5 minutes prompting patient to return to MAU. She reports positive fetal movement. She denies leakage of fluid or vaginal bleeding. Blood pressures were elevated without severe ranges. Cervical change was appreciated with regular contractions every 3 minutes. She denies history of headaches, scotomas, or lower extremity edema. This is her first pregnancy. Patient has been seen by them enough for prenatal care. Substance abuse during pregnancy. No history of chronic hypertension.  Prenatal History/Complications: Gestational hypertension Intrauterine growth restriction (2543 g, <10%tile at 39 weeks)  Trichomonas during pregnancy (treated, TOC 08/19/2016)  Past Medical History: Past Medical History:  Diagnosis Date  . Medical history non-contributory     Past Surgical History: Past Surgical History:  Procedure Laterality Date  . WISDOM TOOTH EXTRACTION      Obstetrical History: OB History    Gravida Para Term Preterm AB Living   1             SAB TAB Ectopic Multiple Live Births                  Social History: Social History   Social History  . Marital status: Single    Spouse name: N/A  . Number of children: N/A  . Years of education: N/A   Social History Main Topics  . Smoking status: Former Smoker    Types: Cigarettes  . Smokeless tobacco: Never Used  . Alcohol use No  . Drug use: No  . Sexual activity: Yes   Other Topics Concern  . None   Social History Narrative  . None    Family History: Family History  Problem Relation Age  of Onset  . Hypertension Maternal Grandmother     Allergies: No Known Allergies  Prescriptions Prior to Admission  Medication Sig Dispense Refill Last Dose  . calcium carbonate (TUMS - DOSED IN MG ELEMENTAL CALCIUM) 500 MG chewable tablet Chew 1 tablet by mouth daily as needed for indigestion or heartburn.   Past Week at Unknown time  . Prenatal Vit-Fe Phos-FA-Omega (VITAFOL GUMMIES) 3.33-0.333-34.8 MG CHEW Chew 3 tablets by mouth daily. 90 tablet 12 Past Week at Unknown time     Review of Systems   All systems reviewed and negative except as stated in HPI  Blood pressure (!) 136/97, pulse 96, temperature 98.6 F (37 C), temperature source Oral, resp. rate 18, height 5\' 3"  (1.6 m), weight 171 lb (77.6 kg), last menstrual period 12/11/2015, SpO2 100 %. General appearance: alert, cooperative and no distress Lungs: clear to auscultation bilaterally Heart: regular rate and rhythm Abdomen: soft, non-tender; bowel sounds normal Extremities: No calf swelling or tenderness Presentation: cephalic Fetal monitoring: baseline 150 bpm, moderate variability, accelerations present, no decelerations Uterine activity: regular q 2-3 mins Dilation: 6 Effacement (%): 100 Station: -1 Exam by:: Irving Burton Rothermel RN    Prenatal labs: ABO, Rh: --/--/O POS (03/27 2137) Antibody: NEG (03/27 2137) Rubella: Immune RPR: Non Reactive (01/11 1004)  HBsAg: Negative (09/28 1557)  HIV: Non Reactive (01/11 1004)  GBS: Positive (03/05 1645)  2 hr Glucola: Normal (75/107/91) Genetic screening:  Normal  Anatomy US: Normal  Prenatal Transfer Tool  Maternal Diabetes: No Genetic Screening: Normal Maternal Ultrasounds/Referrals: Normal Fetal Ultrasounds or other Referrals:  None Maternal Substance Abuse:  No Significant Maternal Medications:  None Significant Maternal Lab Results: Lab values include: Group B Strep positive  Results for orders placed or performed during the hospital encounter of 09/10/16  (from the past 24 hour(s))  Protein / creatinine ratio, urine   Collection Time: 09/10/16 12:30 PM  Result Value Ref Range   Creatinine, Urine 102.00 mg/dL   Total Protein, Urine 17 mg/dL   Protein Creatinine Ratio 0.17 (H) 0.00 - 0.15 mg/mg[Cre]  CBC   Collection Time: 09/10/16  9:37 PM  Result Value Ref Range   WBC 16.0 (H) 4.0 - 10.5 K/uL   RBC 4.08 3.87 - 5.11 MIL/uL   Hemoglobin 10.3 (L) 12.0 - 15.0 g/dL   HCT 16.1 (L) 09.6 - 04.5 %   MCV 76.2 (L) 78.0 - 100.0 fL   MCH 25.2 (L) 26.0 - 34.0 pg   MCHC 33.1 30.0 - 36.0 g/dL   RDW 40.9 81.1 - 91.4 %   Platelets 376 150 - 400 K/uL  Comprehensive metabolic panel   Collection Time: 09/10/16  9:37 PM  Result Value Ref Range   Sodium 133 (L) 135 - 145 mmol/L   Potassium 3.8 3.5 - 5.1 mmol/L   Chloride 105 101 - 111 mmol/L   CO2 17 (L) 22 - 32 mmol/L   Glucose, Bld 93 65 - 99 mg/dL   BUN 6 6 - 20 mg/dL   Creatinine, Ser 7.82 0.44 - 1.00 mg/dL   Calcium 8.4 (L) 8.9 - 10.3 mg/dL   Total Protein 7.2 6.5 - 8.1 g/dL   Albumin 3.2 (L) 3.5 - 5.0 g/dL   AST 18 15 - 41 U/L   ALT 10 (L) 14 - 54 U/L   Alkaline Phosphatase 154 (H) 38 - 126 U/L   Total Bilirubin 0.6 0.3 - 1.2 mg/dL   GFR calc non Af Amer >60 >60 mL/min   GFR calc Af Amer >60 >60 mL/min   Anion gap 11 5 - 15  Type and screen [facility]   Collection Time: 09/10/16  9:37 PM  Result Value Ref Range   ABO/RH(D) O POS    Antibody Screen NEG    Sample Expiration 09/13/2016   Results for orders placed or performed during the hospital encounter of 09/10/16 (from the past 24 hour(s))  Urinalysis, Routine w reflex microscopic   Collection Time: 09/10/16 12:38 PM  Result Value Ref Range   Color, Urine YELLOW YELLOW   APPearance HAZY (A) CLEAR   Specific Gravity, Urine 1.012 1.005 - 1.030   pH 7.0 5.0 - 8.0   Glucose, UA NEGATIVE NEGATIVE mg/dL   Hgb urine dipstick MODERATE (A) NEGATIVE   Bilirubin Urine NEGATIVE NEGATIVE   Ketones, ur NEGATIVE NEGATIVE  mg/dL   Protein, ur NEGATIVE NEGATIVE mg/dL   Nitrite NEGATIVE NEGATIVE   Leukocytes, UA SMALL (A) NEGATIVE   RBC / HPF 0-5 0 - 5 RBC/hpf   WBC, UA 0-5 0 - 5 WBC/hpf   Bacteria, UA RARE (A) NONE SEEN   Squamous Epithelial / LPF 6-30 (A) NONE SEEN   Mucous PRESENT     Patient Active Problem List   Diagnosis Date Noted  . IUGR (intrauterine growth restriction) affecting care of mother 09/10/2016  . Normal labor 09/10/2016  . Small fetus 08/26/2016  . GBS (group B Streptococcus carrier), +RV culture,  currently pregnant 08/21/2016  . Abnormal weight gain during pregnancy, antepartum 06/27/2016  . Supervision of normal pregnancy, antepartum 03/14/2016    Assessment: Redmond Basemanatyana L Lasota is a 21 y.o. G1P0 at 1225w1d here for SOL with gHTN and IUGR.  #Labor:SOL #Pain: None at present #FWB: Category I #ID:  GBS positive on PCN #MOF: Bottle #MOC:Depo #Circ:  N/A  Wendee Beaversavid J McMullen, DO, PGY-1 09/10/2016, 11:42 PM   I spoke with and examined patient and agree with resident/PA/SNM's note and plan of care.  Spontaneous labor w/ cervical change from 4cm to 5cm in MAU, uncomfortable w/ uc's- has since gotten epidural.  GHTN, Denies ha, visual changes, ruq/epigastric pain, n/v.  Pre-e labs normal IUGR <10% Expectant management Cheral MarkerKimberly R. Draxton Luu, CNM, [facility]NP-BC 09/11/2016 12:37 AM

## 2016-09-10 NOTE — MAU Provider Note (Signed)
History     CSN: [id number]  Arrival date and time: 09/10/16 1201   None     Chief Complaint  Patient presents with  . Labor Eval   G1 @39 .1 weeks sent from office for NRNST. She reports good FM. No VB or LOF. Feeling some ctx, not sure frequency. She reports not eating anything since last night and had some water at the office this am. Pregnancy has been complicated by IUGR <10%ile with nml AFI and she is scheduled for IOL in 4 days.   OB History    Gravida Para Term Preterm AB Living   1             SAB TAB Ectopic Multiple Live Births                  History reviewed. No pertinent past medical history.  Past Surgical History:  Procedure Laterality Date  . MOUTH SURGERY      Family History  Problem Relation Age of Onset  . Hypertension Maternal Grandmother     Social History  Substance Use Topics  . Smoking status: Former Smoker    Types: Cigarettes  . Smokeless tobacco: Never Used  . Alcohol use No    Allergies: No Known Allergies  Prescriptions Prior to Admission  Medication Sig Dispense Refill Last Dose  . Prenatal Vit-Fe Phos-FA-Omega (VITAFOL GUMMIES) 3.33-0.333-34.8 MG CHEW Chew 3 tablets by mouth daily. 90 tablet 12 Taking    Review of Systems  Eyes: Negative for visual disturbance.  Gastrointestinal: Positive for abdominal pain (ctx).  Genitourinary: Negative for vaginal bleeding and vaginal discharge.  Neurological: Negative for headaches.   Physical Exam   Blood pressure 133/84, pulse 88, temperature 97.8 F (36.6 C), temperature source Oral, resp. rate 16, height 5\' 3"  (1.6 m), weight 77.6 kg (171 lb), last menstrual period 12/11/2015.  Physical Exam  Constitutional: She is oriented to person, place, and time. She appears well-developed and well-nourished. No distress (appears comfortable).  HENT:  Head: Normocephalic and atraumatic.  Neck: Normal range of motion.  Respiratory: Effort normal.  GI: Soft. She exhibits no distension.  There is no tenderness.  gravid  Genitourinary:  Genitourinary Comments: 2.5/80 per RN  Musculoskeletal: Normal range of motion.  Neurological: She is alert and oriented to person, place, and time.  Skin: Skin is warm and dry.  Psychiatric: She has a normal mood and affect.   EFM: 145 bpm, mod variability, + accels, no decels Toco: irregular  Results for orders placed or performed during the hospital encounter of 09/10/16 (from the past 24 hour(s))  Urinalysis, Routine w reflex microscopic     Status: Abnormal   Collection Time: 09/10/16 12:38 PM  Result Value Ref Range   Color, Urine YELLOW YELLOW   APPearance HAZY (A) CLEAR   Specific Gravity, Urine 1.012 1.005 - 1.030   pH 7.0 5.0 - 8.0   Glucose, UA NEGATIVE NEGATIVE mg/dL   Hgb urine dipstick MODERATE (A) NEGATIVE   Bilirubin Urine NEGATIVE NEGATIVE   Ketones, ur NEGATIVE NEGATIVE mg/dL   Protein, ur NEGATIVE NEGATIVE mg/dL   Nitrite NEGATIVE NEGATIVE   Leukocytes, UA SMALL (A) NEGATIVE   RBC / HPF 0-5 0 - 5 RBC/hpf   WBC, UA 0-5 0 - 5 WBC/hpf   Bacteria, UA RARE (A) NONE SEEN   Squamous Epithelial / LPF 6-30 (A) NONE SEEN   Mucous PRESENT    MAU Course  Procedures Po hydration Food provided  MDM Labs ordered  and reviewed. Cervix unchanged after 1 hr, ctx irregular, no evidence of labor. NST reactive. Stable for discharge home.  Assessment and Plan   1. [redacted] weeks gestation of pregnancy   2. NST (non-stress test) reactive   3. Poor fetal growth affecting management of mother in third trimester, single or unspecified fetus    Discharge home [facility] Labor precautions Follow up at [facility] in 4 days for IOL  Allergies as of 09/10/2016   No Known Allergies     Medication List    TAKE these medications   VITAFOL GUMMIES 3.33-0.333-34.8 MG Chew Chew 3 tablets by mouth daily. What changed:  how much to take  when to take this      Melody Patrick, CNM 09/10/2016, 1:32 PM

## 2016-09-10 NOTE — Discharge Instructions (Signed)
Braxton Hicks Contractions °Contractions of the uterus can occur throughout pregnancy, but they are not always a sign that you are in labor. You may have practice contractions called Braxton Hicks contractions. These false labor contractions are sometimes confused with true labor. °What are Braxton Hicks contractions? °Braxton Hicks contractions are tightening movements that occur in the muscles of the uterus before labor. Unlike true labor contractions, these contractions do not result in opening (dilation) and thinning of the cervix. Toward the end of pregnancy (32-34 weeks), Braxton Hicks contractions can happen more often and may become stronger. These contractions are sometimes difficult to tell apart from true labor because they can be very uncomfortable. You should not feel embarrassed if you go to the hospital with false labor. °Sometimes, the only way to tell if you are in true labor is for your health care provider to look for changes in the cervix. The health care provider will do a physical exam and may monitor your contractions. If you are not in true labor, the exam should show that your cervix is not dilating and your water has not broken. °If there are no prenatal problems or other health problems associated with your pregnancy, it is completely safe for you to be sent home with false labor. You may continue to have Braxton Hicks contractions until you go into true labor. °How can I tell the difference between true labor and false labor? °· Differences °¨ False labor °¨ Contractions last 30-70 seconds.: Contractions are usually shorter and not as strong as true labor contractions. °¨ Contractions become very regular.: Contractions are usually irregular. °¨ Discomfort is usually felt in the top of the uterus, and it spreads to the lower abdomen and low back.: Contractions are often felt in the front of the lower abdomen and in the groin. °¨ Contractions do not go away with walking.: Contractions may  go away when you walk around or change positions while lying down. °¨ Contractions usually become more intense and increase in frequency.: Contractions get weaker and are shorter-lasting as time goes on. °¨ The cervix dilates and gets thinner.: The cervix usually does not dilate or become thin. °Follow these instructions at home: °¨ Take over-the-counter and prescription medicines only as told by your health care provider. °¨ Keep up with your usual exercises and follow other instructions from your health care provider. °¨ Eat and drink lightly if you think you are going into labor. °¨ If Braxton Hicks contractions are making you uncomfortable: °¨ Change your position from lying down or resting to walking, or change from walking to resting. °¨ Sit and rest in a tub of warm water. °¨ Drink enough fluid to keep your urine clear or pale yellow. Dehydration may cause these contractions. °¨ Do slow and deep breathing several times an hour. °¨ Keep all follow-up prenatal visits as told by your health care provider. This is important. °Contact a health care provider if: °¨ You have a fever. °¨ You have continuous pain in your abdomen. °Get help right away if: °¨ Your contractions become stronger, more regular, and closer together. °¨ You have fluid leaking or gushing from your vagina. °¨ You pass blood-tinged mucus (bloody show). °¨ You have bleeding from your vagina. °¨ You have low back pain that you never had before. °¨ You feel your baby’s head pushing down and causing pelvic pressure. °¨ Your baby is not moving inside you as much as it used to. °Summary °¨ Contractions that occur before labor are   called Braxton Hicks contractions, false labor, or practice contractions. °¨ Braxton Hicks contractions are usually shorter, weaker, farther apart, and less regular than true labor contractions. True labor contractions usually become progressively stronger and regular and they become more frequent. °¨ Manage discomfort from  Braxton Hicks contractions by changing position, resting in a warm bath, drinking plenty of water, or practicing deep breathing. °This information is not intended to replace advice given to you by your health care provider. Make sure you discuss any questions you have with your health care provider. °Document Released: 06/03/2005 Document Revised: 04/22/2016 Document Reviewed: 04/22/2016 °Elsevier Interactive Patient Education © 2017 [employer] °Fetal Movement Counts °Patient Name: ________________________________________________ Patient Due Date: ____________________ °What is a fetal movement count? °A fetal movement count is the number of times that you feel your baby move during a certain amount of time. This may also be called a fetal kick count. A fetal movement count is recommended for every pregnant woman. You may be asked to start counting fetal movements as early as week 28 of your pregnancy. °Pay attention to when your baby is most active. You may notice your baby's sleep and wake cycles. You may also notice things that make your baby move more. You should do a fetal movement count: °· When your baby is normally most active. °· At the same time each day. °A good time to count movements is while you are resting, after having something to eat and drink. °How do I count fetal movements? °1. Find a quiet, comfortable area. Sit, or lie down on your side. °2. Write down the date, the start time and stop time, and the number of movements that you felt between those two times. Take this information with you to your health care visits. °3. For 2 hours, count kicks, flutters, swishes, rolls, and jabs. You should feel at least 10 movements during 2 hours. °4. You may stop counting after you have felt 10 movements. °5. If you do not feel 10 movements in 2 hours, have something to eat and drink. Then, keep resting and counting for 1 hour. If you feel at least 4 movements during that hour, you may stop  counting. °Contact a health care provider if: °· You feel fewer than 4 movements in 2 hours. °· Your baby is not moving like he or she usually does. °Date: ____________ Start time: ____________ Stop time: ____________ Movements: ____________ °Date: ____________ Start time: ____________ Stop time: ____________ Movements: ____________ °Date: ____________ Start time: ____________ Stop time: ____________ Movements: ____________ °Date: ____________ Start time: ____________ Stop time: ____________ Movements: ____________ °Date: ____________ Start time: ____________ Stop time: ____________ Movements: ____________ °Date: ____________ Start time: ____________ Stop time: ____________ Movements: ____________ °Date: ____________ Start time: ____________ Stop time: ____________ Movements: ____________ °Date: ____________ Start time: ____________ Stop time: ____________ Movements: ____________ °Date: ____________ Start time: ____________ Stop time: ____________ Movements: ____________ °This information is not intended to replace advice given to you by your health care provider. Make sure you discuss any questions you have with your health care provider. °Document Released: 07/03/2006 Document Revised: 01/31/2016 Document Reviewed: 07/13/2015 °Elsevier Interactive Patient Education © 2017 Else[employer] Inc[employer] ° °

## 2016-09-11 ENCOUNTER — Encounter (HOSPITAL_COMMUNITY): Payer: Self-pay | Admitting: *Deleted

## 2016-09-11 DIAGNOSIS — O99824 Streptococcus B carrier state complicating childbirth: Secondary | ICD-10-CM

## 2016-09-11 DIAGNOSIS — O134 Gestational [pregnancy-induced] hypertension without significant proteinuria, complicating childbirth: Secondary | ICD-10-CM

## 2016-09-11 DIAGNOSIS — O365930 Maternal care for other known or suspected poor fetal growth, third trimester, not applicable or unspecified: Secondary | ICD-10-CM

## 2016-09-11 DIAGNOSIS — Z3A39 39 weeks gestation of pregnancy: Secondary | ICD-10-CM

## 2016-09-11 LAB — RPR: RPR: NONREACTIVE

## 2016-09-11 LAB — ABO/RH: ABO/RH(D): O POS

## 2016-09-11 MED ORDER — WITCH HAZEL-GLYCERIN EX PADS: 1.0000 "application " | MEDICATED_PAD | CUTANEOUS | Status: DC | PRN

## 2016-09-11 MED ORDER — ONDANSETRON HCL 4 MG PO TABS: 4.0000 mg | ORAL_TABLET | ORAL | Status: DC | PRN

## 2016-09-11 MED ORDER — SODIUM CHLORIDE 0.9 % IV SOLN: 250.0000 mL | INTRAVENOUS | Status: DC | PRN

## 2016-09-11 MED ORDER — COCONUT OIL OIL: 1.0000 "application " | TOPICAL_OIL | Status: DC | PRN

## 2016-09-11 MED ORDER — FLEET ENEMA 7-19 GM/118ML RE ENEM: 1.0000 | ENEMA | Freq: Every day | RECTAL | Status: DC | PRN

## 2016-09-11 MED ORDER — SENNOSIDES-DOCUSATE SODIUM 8.6-50 MG PO TABS
2.0000 | ORAL_TABLET | ORAL | Status: DC
  Administered 2016-09-11: 2 via ORAL
  Filled 2016-09-11 (×2): qty 2

## 2016-09-11 MED ORDER — TETANUS-DIPHTH-ACELL PERTUSSIS 5-2.5-18.5 LF-MCG/0.5 IM SUSP: 0.5000 mL | Freq: Once | INTRAMUSCULAR | Status: DC

## 2016-09-11 MED ORDER — SODIUM CHLORIDE 0.9% FLUSH: 3.0000 mL | INTRAVENOUS | Status: DC | PRN

## 2016-09-11 MED ORDER — MEASLES, MUMPS & RUBELLA VAC ~~LOC~~ INJ
0.5000 mL | INJECTION | Freq: Once | SUBCUTANEOUS | Status: DC
  Filled 2016-09-11: qty 0.5

## 2016-09-11 MED ORDER — SIMETHICONE 80 MG PO CHEW: 80.0000 mg | CHEWABLE_TABLET | ORAL | Status: DC | PRN

## 2016-09-11 MED ORDER — ONDANSETRON HCL 4 MG/2ML IJ SOLN: 4.0000 mg | INTRAMUSCULAR | Status: DC | PRN

## 2016-09-11 MED ORDER — IBUPROFEN 600 MG PO TABS
600.0000 mg | ORAL_TABLET | Freq: Four times a day (QID) | ORAL | Status: DC
  Administered 2016-09-11 – 2016-09-13 (×10): 600 mg via ORAL
  Filled 2016-09-11 (×10): qty 1

## 2016-09-11 MED ORDER — DIBUCAINE 1 % RE OINT: 1.0000 "application " | TOPICAL_OINTMENT | RECTAL | Status: DC | PRN

## 2016-09-11 MED ORDER — ZOLPIDEM TARTRATE 5 MG PO TABS: 5.0000 mg | ORAL_TABLET | Freq: Every evening | ORAL | Status: DC | PRN

## 2016-09-11 MED ORDER — ACETAMINOPHEN 325 MG PO TABS: 650.0000 mg | ORAL_TABLET | ORAL | Status: DC | PRN

## 2016-09-11 MED ORDER — SODIUM CHLORIDE 0.9% FLUSH: 3.0000 mL | Freq: Two times a day (BID) | INTRAVENOUS | Status: DC

## 2016-09-11 MED ORDER — BISACODYL 10 MG RE SUPP: 10.0000 mg | Freq: Every day | RECTAL | Status: DC | PRN

## 2016-09-11 MED ORDER — DIPHENHYDRAMINE HCL 25 MG PO CAPS: 25.0000 mg | ORAL_CAPSULE | Freq: Four times a day (QID) | ORAL | Status: DC | PRN

## 2016-09-11 MED ORDER — PRENATAL MULTIVITAMIN CH
1.0000 | ORAL_TABLET | Freq: Every day | ORAL | Status: DC
  Administered 2016-09-11 – 2016-09-13 (×3): 1 via ORAL
  Filled 2016-09-11 (×3): qty 1

## 2016-09-11 MED ORDER — BENZOCAINE-MENTHOL 20-0.5 % EX AERO
1.0000 "application " | INHALATION_SPRAY | CUTANEOUS | Status: DC | PRN
  Administered 2016-09-11: 1 via TOPICAL
  Filled 2016-09-11: qty 56

## 2016-09-11 NOTE — Progress Notes (Signed)
Melody Patrick is a 21 y.o. G1P0 at 6341w2d   Subjective: Patient feeling more intense contractions. No other complaints.   Objective: BP (!) 145/93   Pulse 92   Temp 98.6 F (37 C) (Oral)   Resp 18   Ht 5\' 3"  (1.6 m)   Wt 171 lb (77.6 kg)   LMP 12/11/2015 (Exact Date)   SpO2 100%   BMI 30.29 kg/m  No intake/output data recorded. No intake/output data recorded.  FHT:  FHR: 140 bpm, variability: moderate,  accelerations:  Present,  decelerations:  Absent UC:   regular, every 4 minutes SVE:   Dilation: 7 Effacement (%): 100 Station: 0 Exam by:: Irving BurtonEmily Rothermel RN   Labs: Lab Results  Component Value Date   WBC 16.0 (H) 09/10/2016   HGB 10.3 (L) 09/10/2016   HCT 31.1 (L) 09/10/2016   MCV 76.2 (L) 09/10/2016   PLT 376 09/10/2016    Assessment / Plan: Spontaneous labor, progressing normally. BP remain intermittently elevated in 130-140s/89-90s. Progressing without pitocin.  Labor: Progressing normally Preeclampsia:  None Fetal Wellbeing:  Category I Pain Control:  Labor support without medications I/D:  GBS positive on PCN Anticipated MOD:  NSVD  Wendee Beaversavid J McMullen, DO, PGY-1 09/11/2016, 12:56 AM

## 2016-09-11 NOTE — Anesthesia Postprocedure Evaluation (Signed)
Anesthesia Post Note  Patient: Melody Patrick  Procedure(s) Performed: * No procedures listed *  Patient location during evaluation: Mother Baby Anesthesia Type: Epidural Level of consciousness: awake and awake and alert Pain management: pain level controlled Vital Signs Assessment: post-procedure vital signs reviewed and stable Respiratory status: spontaneous breathing Cardiovascular status: stable Postop Assessment: no headache, no backache, epidural receding and patient able to bend at knees Anesthetic complications: no        Last Vitals:  Vitals:   09/11/16 0427 09/11/16 0520  BP: 134/83 121/64  Pulse: (!) 105 88  Resp: 14 16  Temp: 37.1 C 37 C    Last Pain:  Vitals:   09/11/16 0520  TempSrc: Axillary  PainSc: 0-No pain   Pain Goal:                 Edison PaceWILKERSON,Aadan Chenier

## 2016-09-11 NOTE — Progress Notes (Signed)
Post Partum Day #0 Subjective: no complaints, voiding, tolerating PO and epidural is wearing off, educated to call for assistance when ambulating.  Bottle feeding.    Objective: Blood pressure 121/64, pulse 88, temperature 98.6 F (37 C), temperature source Axillary, resp. rate 16, height 5\' 3"  (1.6 m), weight 171 lb (77.6 kg), last menstrual period 12/11/2015, SpO2 100 %, unknown if currently breastfeeding.  Physical Exam:  General: alert, cooperative and no distress Lochia: appropriate Uterine Fundus: firm Incision: none DVT Evaluation: No evidence of DVT seen on physical exam. No cords or calf tenderness. No significant calf/ankle edema.   Recent Labs  09/10/16 2137  HGB 10.3*  HCT 31.1*    Assessment/Plan: Contraception Depo injections planned.  Continue current care.  Blood pressures improving since delivery.    LOS: 1 day   Roe CoombsRachelle A Anneka Studer, CNM 09/11/2016, 7:17 AM

## 2016-09-12 NOTE — Progress Notes (Signed)
Post Partum Day #1 Subjective: no complaints, up ad lib, voiding and tolerating PO  Objective: Blood pressure 119/71, pulse 82, temperature 98.1 F (36.7 C), resp. rate 18, height 5\' 3"  (1.6 m), weight 171 lb (77.6 kg), last menstrual period 12/11/2015, SpO2 100 %, unknown if currently breastfeeding.  Physical Exam:  General: alert, cooperative and no distress Lochia: appropriate Uterine Fundus: firm Incision: none DVT Evaluation: No evidence of DVT seen on physical exam. No cords or calf tenderness. No significant calf/ankle edema.   Recent Labs  09/10/16 2137  HGB 10.3*  HCT 31.1*    Assessment/Plan: Plan for discharge tomorrow and Contraception Depo injections   LOS: 2 days   Roe CoombsRachelle A Alesa Patrick, CNM 09/12/2016, 7:38 AM

## 2016-09-13 MED ORDER — IBUPROFEN 600 MG PO TABS: 600.0000 mg | ORAL_TABLET | Freq: Four times a day (QID) | ORAL | 0 refills | Status: DC

## 2016-09-13 MED ORDER — MEDROXYPROGESTERONE ACETATE 150 MG/ML IM SUSP
150.0000 mg | Freq: Once | INTRAMUSCULAR | Status: AC
  Administered 2016-09-13: 150 mg via INTRAMUSCULAR
  Filled 2016-09-13: qty 1

## 2016-09-13 NOTE — Discharge Instructions (Signed)

## 2016-09-13 NOTE — Clinical Social Work Maternal (Signed)
  CLINICAL SOCIAL WORK MATERNAL/CHILD NOTE  Patient Details  Name: Melody Patrick MRN: [id number] Date of Birth: 09/07/1995  Date:  09/13/2016  Clinical Social Worker Initiating Note:  Nocholas Damaso Boyd-Gilyard Date/ Time Initiated:  09/13/16/1040     Child's Name:  Melody Patrick   Legal Guardian:  Mother (FOb is Melody Patrick 01/16/1991)   Need for Interpreter:  None   Date of Referral:  09/12/16     Reason for Referral:  Current Substance Use/Substance Use During Pregnancy    Referral Source:  Central Nursery   Address:  [address] [place] [address]   Phone number:  [phone]   Household Members:  Self, Parents, Siblings   Natural Supports (not living in the home):  Spouse/significant other, Immediate Family, Extended Family   Professional Supports: None   Employment: Part-time   Type of Work: [employer]   Education:  High school graduate   Financial Resources:  Medicaid   Other Resources:  Food Stamps , WIC   Cultural/Religious Considerations Which May Impact Care:  Per MOB's Face Sheet, MOB is [employer].   Strengths:  Ability to meet basic needs , Pediatrician chosen , Home prepared for child    Risk Factors/Current Problems:  Substance Use    Cognitive State:  Alert , Able to Concentrate , Insightful , Linear Thinking    Mood/Affect:  Bright , Happy , Interested , Comfortable , Relaxed    CSW Assessment: CSW met with MOB to complete an assessment for a consult for substance abuse hx. When CSW arrived, MOB was in bed engaging in skin to skin with infant.  With MOB's permission, CSW asked MOB guest to leave the room in effort for CSW to meet with MOB in private. MOB was polite and receptive to meeting with CSW. 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  CSW made MOB aware of the two screenings for the infant. 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  CSW thanked MOB for being honest, and informed MOB that the infant's UDS was negative and CSW will monitor CDS. CSW made MOB that if infant CDS is positive without an explanation, CSW will make a report to [place] CPS. MOB was understanding and did not have any questions or concerns. CSW offered resources and referrals for SA treatment and MOB declined.  CSW educated MOB about PPD and informed MOB of possible supports and interventions to decrease PPD.  CSW also encouraged MOB to seek medical attention if needed for increased signs and symptoms for PPD. CSW reviewed safe sleep and SIDS and MOB asked appropriate question and responded appropriately.  CSW thanked MOB for meeting CSW.  CSW provided MOB with CSW contact information and was encouraged to contact CSW if any questions or concerns arise.    CSW Plan/Description:  Information/Referral to Community Resources , No Further Intervention Required/No Barriers to Discharge, Patient/Family Education  (CSW will monitor infant's CDS and will make a report if needed. )   Adahlia Stembridge Boyd-Gilyard, MSW, LCSW Clinical Social Work [phone]   Giamarie Bueche D BOYD-GILYARD, LCSW 09/13/2016, 10:44 AM 

## 2016-09-13 NOTE — Discharge Summary (Signed)
OB Discharge Summary  Patient Name: Melody Patrick DOB: 1995-10-17 MRN: [id number]  Date of admission: 09/10/2016 Delivering MD: Wendee Beavers   Date of discharge: 09/13/2016  Admitting diagnosis: 39w ctx every 5 or less min Intrauterine pregnancy: [redacted]w[redacted]d     Secondary diagnosis:Active Problems:   Normal labor  Additional problems:+ GBS, anemia during pregnancy     Discharge diagnosis: Term Pregnancy Delivered and Anemia                                                                      Complications: None  Hospital course:  Onset of Labor With Vaginal Delivery     21 y.o. yo G1P1001 at [redacted]w[redacted]d was admitted in Active Labor on 09/10/2016. Patient had an uncomplicated labor course as follows:  Membrane Rupture Time/Date: 12:46 AM ,09/11/2016   Intrapartum Procedures: Episiotomy: None [1]                                         Lacerations:  None [1]  Patient had a delivery of a Viable infant. 09/11/2016  Information for the patient's newborn:  Christel, Bai Girl Starlena [[id number]]  Delivery Method: Vag-Spont    Pateint had an uncomplicated postpartum course.  She is ambulating, tolerating a regular diet, passing flatus, and urinating well. Patient is discharged home in stable condition on 09/13/16.   Physical exam  Vitals:   09/11/16 1831 09/12/16 0536 09/12/16 1833 09/13/16 0500  BP: 120/75 119/71 116/71 127/79  Pulse: 80 82 95 76  Resp: Temp: 97.7 F (36.5 C) 98.1 F (36.7 C) 98.5 F (36.9 C) 98.3 F (36.8 C)  TempSrc: Axillary  Oral Oral  SpO2:      Weight:      Height:       General: alert Lochia: appropriate Uterine Fundus: firm Incision: N/A DVT Evaluation: No evidence of DVT seen on physical exam. Labs: Lab Results  Component Value Date   WBC 16.0 (H) 09/10/2016   HGB 10.3 (L) 09/10/2016   HCT 31.1 (L) 09/10/2016   MCV 76.2 (L) 09/10/2016   PLT 376 09/10/2016   CMP Latest Ref Rng & Units 09/10/2016  Glucose 65 - 99 mg/dL 93   BUN 6 - 20 mg/dL 6  Creatinine 0.86 - 5.78 mg/dL 4.69  Sodium 629 - 528 mmol/L 133(L)  Potassium 3.5 - 5.1 mmol/L 3.8  Chloride 101 - 111 mmol/L 105  CO2 22 - 32 mmol/L 17(L)  Calcium 8.9 - 10.3 mg/dL 4.1(L)  Total Protein 6.5 - 8.1 g/dL 7.2  Total Bilirubin 0.3 - 1.2 mg/dL 0.6  Alkaline Phos 38 - 126 U/L 154(H)  AST 15 - 41 U/L 18  ALT 14 - 54 U/L 10(L)    Discharge instruction: per After Visit Summary and "Baby and Me Booklet".  After Visit Meds:  Allergies as of 09/13/2016   No Known Allergies     Medication List    TAKE these medications   calcium carbonate 500 MG chewable tablet Commonly known as:  TUMS - dosed in mg elemental calcium Chew 1 tablet by mouth daily as needed  for indigestion or heartburn.   ibuprofen 600 MG tablet Commonly known as:  ADVIL,MOTRIN Take 1 tablet (600 mg total) by mouth every 6 (six) hours.   VITAFOL GUMMIES 3.33-0.333-34.8 MG Chew Chew 3 tablets by mouth daily.       Diet: routine diet  Activity: Advance as tolerated. Pelvic rest for 6 weeks.   Outpatient follow up:6 weeks Follow up Appt:No future appointments. Follow up visit: No Follow-up on file.  Postpartum contraception: Depo Provera prior to discharge  Newborn Data: Live born female  Birth Weight: 6 lb (2722 g) APGAR: 8, 9  Baby Feeding: Bottle Disposition:home with mother   09/13/2016 Allie Bossier, MD

## 2016-09-14 ENCOUNTER — Inpatient Hospital Stay (HOSPITAL_COMMUNITY): Admission: RE | Admit: 2016-09-14 | Payer: Medicaid Other | Source: Ambulatory Visit

## 2016-10-09 ENCOUNTER — Ambulatory Visit (INDEPENDENT_AMBULATORY_CARE_PROVIDER_SITE_OTHER): Payer: Medicaid Other | Admitting: Obstetrics & Gynecology

## 2016-10-09 ENCOUNTER — Encounter: Payer: Self-pay | Admitting: Obstetrics & Gynecology

## 2016-10-09 DIAGNOSIS — Z3042 Encounter for surveillance of injectable contraceptive: Secondary | ICD-10-CM | POA: Insufficient documentation

## 2016-10-09 MED ORDER — MEDROXYPROGESTERONE ACETATE 150 MG/ML IM SUSP: 150.0000 mg | INTRAMUSCULAR | 1 refills | Status: DC

## 2016-10-09 NOTE — Progress Notes (Signed)
..  Post Partum Exam  Melody Patrick is a 22 y.o. G51P1001 female who presents for a postpartum visit. She is 4 weeks postpartum following a spontaneous vaginal delivery. I have fully reviewed the prenatal and intrapartum course. The delivery was at 39.2 gestational weeks.  Anesthesia: epidural. Postpartum course has been normal. Baby's course has been good. Baby is feeding by bottle - Similac Advance. Bleeding staining only. Bowel function is normal. Bladder function is normal. Patient is not sexually active. Contraception method is Depo-Provera injections. Postpartum depression screening:neg  The following portions of the patient's history were reviewed and updated as appropriate: allergies, current medications, past family history, past medical history, past social history, past surgical history and problem list.  Review of Systems Pertinent items are noted in HPI.    Objective:  unknown if currently breastfeeding.  General:  alert, cooperative and no distress           Abdomen: soft, non-tender; bowel sounds normal; no masses,  no organomegaly   Vulva:  not evaluated  Vagina: not evaluated   Assessment:    Normal postpartum exam. Pap smear not done at today's visit.   Plan:   1. Contraception: Depo-Provera injections 2. Discussed LARC options 3. Follow up in: 8 weeks or as needed. Depo Provera   Adam Phenix, MD 10/09/2016

## 2016-10-09 NOTE — Patient Instructions (Signed)

## 2016-12-03 ENCOUNTER — Ambulatory Visit: Payer: Medicaid Other

## 2017-05-16 ENCOUNTER — Ambulatory Visit (HOSPITAL_COMMUNITY)
Admission: EM | Admit: 2017-05-16 | Discharge: 2017-05-16 | Disposition: A | Discharge status: Home / Self Care | Payer: Medicaid Other | Attending: Family Medicine | Admitting: Family Medicine

## 2017-05-16 ENCOUNTER — Encounter (HOSPITAL_COMMUNITY): Payer: Self-pay | Admitting: Family Medicine

## 2017-05-16 DIAGNOSIS — Z3202 Encounter for pregnancy test, result negative: Secondary | ICD-10-CM | POA: Diagnosis not present

## 2017-05-16 DIAGNOSIS — B9689 Other specified bacterial agents as the cause of diseases classified elsewhere: Secondary | ICD-10-CM | POA: Diagnosis not present

## 2017-05-16 DIAGNOSIS — N939 Abnormal uterine and vaginal bleeding, unspecified: Secondary | ICD-10-CM | POA: Diagnosis not present

## 2017-05-16 DIAGNOSIS — N760 Acute vaginitis: Secondary | ICD-10-CM | POA: Diagnosis not present

## 2017-05-16 LAB — POCT I-STAT, CHEM 8
BUN: 6 mg/dL (ref 6–20)
CALCIUM ION: 1.19 mmol/L (ref 1.15–1.40)
Chloride: 106 mmol/L (ref 101–111)
Creatinine, Ser: 0.9 mg/dL (ref 0.44–1.00)
GLUCOSE: 78 mg/dL (ref 65–99)
HCT: 38 % (ref 36.0–46.0)
HEMOGLOBIN: 12.9 g/dL (ref 12.0–15.0)
POTASSIUM: 3.8 mmol/L (ref 3.5–5.1)
Sodium: 141 mmol/L (ref 135–145)
TCO2: 26 mmol/L (ref 22–32)

## 2017-05-16 LAB — POCT PREGNANCY, URINE: PREG TEST UR: NEGATIVE

## 2017-05-16 MED ORDER — MEDROXYPROGESTERONE ACETATE 10 MG PO TABS: 10.0000 mg | ORAL_TABLET | Freq: Every day | ORAL | 0 refills | Status: DC

## 2017-05-16 NOTE — Discharge Instructions (Signed)
Your bleeding may be related to your hormones being out of balance post-partum.  We are checking for STD's, we will call if any are positive. Pregnancy test was negative. Hemoglobin (blood level) was good.  Take Provera pills daily for 10 days. This should stop the bleeding.  If bleeding continues after taking the pills please set up an appointment [facility] to discuss other causes of bleeding and possible long term birth control management.   If you continue to bleed and start to experience light headedness, dizziness, headaches, please return.

## 2017-05-16 NOTE — ED Triage Notes (Signed)
Pt here for irregular bleeding. Reports that when her daughter turned 867 months old it started to come every other week. sts the bleeding is light to spotting. Denies birth control. Denies abd pain.

## 2017-05-16 NOTE — ED Provider Notes (Signed)
[facility]    CSN: [id number] Arrival date & time: 05/16/17  1455     History   Chief Complaint Chief Complaint  Patient presents with  . Vaginal Bleeding    HPI Melody Patrick is a 21 y.o. female presenting with irregular vaginal bleeding for 1 month. She is 8 months post partum. She received Depo Provera shot in hospital after giving birth. Her period returned 5-6 months after birth, was normal for about 2 months. This past month has been irregular and constant. For example, she start bleeding last Tuesday and it lightened up by the end of the week. Bleeding began again this Monday and have persisted to today, Friday. She does believe her bleeding is lighter today. She is not on any OCP's. Patient is a smoker. Is not breastfeeding. She has had unprotected sex with one partner. States she had a similar issue with Depo in the past- prior to pregnancy, has not received a second shot since birth. States if she were to go pack on birth control she would want pills.  Also brings up itchiness on her back. Uses coconut oil for it.  HPI  Past Medical History:  Diagnosis Date  . Medical history non-contributory     Patient Active Problem List   Diagnosis Date Noted  . On Depo-Provera for contraception 10/09/2016    Past Surgical History:  Procedure Laterality Date  . WISDOM TOOTH EXTRACTION      OB History    Gravida Para Term Preterm AB Living   1 1 1     1    SAB TAB Ectopic Multiple Live Births         0 1       Home Medications    Prior to Admission medications   Medication Sig Start Date End Date Taking? Authorizing Provider  calcium carbonate (TUMS - DOSED IN MG ELEMENTAL CALCIUM) 500 MG chewable tablet Chew 1 tablet by mouth daily as needed for indigestion or heartburn.    [provider]  ibuprofen (ADVIL,MOTRIN) 600 MG tablet Take 1 tablet (600 mg total) by mouth every 6 (six) hours. Patient not taking: Reported on 10/09/2016 09/13/16    Allie Bossierove, Myra C, MD  medroxyPROGESTERone (DEPO-PROVERA) 150 MG/ML injection Inject 1 mL (150 mg total) into the muscle every 3 (three) months. 10/09/16   Adam PhenixArnold, James G, MD  medroxyPROGESTERone (PROVERA) 10 MG tablet Take 1 tablet (10 mg total) by mouth daily for 10 days. 05/16/17 05/26/17  Anthonio Mizzell C, PA-C  Prenatal Vit-Fe Phos-FA-Omega (VITAFOL GUMMIES) 3.33-0.333-34.8 MG CHEW Chew 3 tablets by mouth daily. Patient not taking: Reported on 10/09/2016 03/14/16   Roe Coombsenney, Rachelle A, CNM    Family History Family History  Problem Relation Age of Onset  . Hypertension Maternal Grandmother     Social History Social History   Tobacco Use  . Smoking status: Former Smoker    Types: Cigarettes  . Smokeless tobacco: Never Used  Substance Use Topics  . Alcohol use: No  . Drug use: No   Patient is a current smoker.   Allergies   Patient has no known allergies.   Review of Systems Review of Systems  Constitutional: Negative for chills, fatigue and fever.  Respiratory: Negative for shortness of breath.   Cardiovascular: Negative for chest pain.  Gastrointestinal: Negative for abdominal pain, anal bleeding, diarrhea, nausea and vomiting.  Genitourinary: Positive for menstrual problem and vaginal bleeding. Negative for dysuria, flank pain and vaginal discharge.  Neurological: Negative for  dizziness, weakness, light-headedness and headaches.     Physical Exam Triage Vital Signs ED Triage Vitals [05/16/17 1524]  Enc Vitals Group     BP 107/70     Pulse Rate 79     Resp 18     Temp 98.2 F (36.8 C)     Temp src      SpO2 97 %     Weight      Height      Head Circumference      Peak Flow      Pain Score      Pain Loc      Pain Edu?      Excl. in GC?    No data found.  Updated Vital Signs BP 107/70   Pulse 79   Temp 98.2 F (36.8 C)   Resp 18   LMP 05/16/2017   SpO2 97%   Visual Acuity Right Eye Distance:   Left Eye Distance:   Bilateral Distance:    Right  Eye Near:   Left Eye Near:    Bilateral Near:     Physical Exam  Constitutional: She is oriented to person, place, and time. She appears well-developed and well-nourished.  No acute distress, does not appear tired  Cardiovascular: Normal rate and regular rhythm.  Pulmonary/Chest: Effort normal and breath sounds normal.  Abdominal: Soft. She exhibits no distension. There is no tenderness. There is no guarding.  Genitourinary:  Genitourinary Comments: Pelvic Exam: Vaginal vault with mild amount of bright red blood. Cervical os with bright red blood and white discharge. No CMT or adnexal tenderness.  Neurological: She is alert and oriented to person, place, and time.  Skin: Skin is warm and dry.  Upper back dry and mild cracking of skin.  Psychiatric: She has a normal mood and affect. Her behavior is normal.     UC Treatments / Results  Labs (all labs ordered are listed, but only abnormal results are displayed) Labs Reviewed  POCT PREGNANCY, URINE  POCT I-STAT, CHEM 8  CERVICOVAGINAL ANCILLARY ONLY    EKG  EKG Interpretation None       Radiology No results found.  Procedures Procedures (including critical care time)  Medications Ordered in UC Medications - No data to display   Initial Impression / Assessment and Plan / UC Course  I have reviewed the triage vital signs and the nursing notes.  Pertinent labs & imaging results that were available during my care of the patient were reviewed by me and considered in my medical decision making (see chart for details).     Patient's hormones may be off balance from pregnancy and lack of OCP's. Pregnancy negative, I-stat Hgb 12.9, STD screening obtained. Provera given for 10 days to stop her bleeding. [facility] if bleeding persists and discuss birth control options given she is a smoker. History and Exam less concerning for UTI, PID.  Final Clinical Impressions(s) / UC  Diagnoses   Final diagnoses:  Vaginal bleeding    ED Discharge Orders        Ordered    medroxyPROGESTERone (PROVERA) 10 MG tablet  Daily     05/16/17 1621       Controlled Substance Prescriptions  Controlled Substance Registry consulted? Not Applicable   Lew DawesWieters, Iolanda Folson C, PA-C 05/16/17 1704    Lew DawesWieters, Jonathon Tan C, [place]PA-C 05/16/17 1706

## 2017-05-19 LAB — CERVICOVAGINAL ANCILLARY ONLY
Bacterial vaginitis: POSITIVE — AB
CANDIDA VAGINITIS: NEGATIVE
CHLAMYDIA, DNA PROBE: NEGATIVE
Neisseria Gonorrhea: NEGATIVE
TRICH (WINDOWPATH): NEGATIVE

## 2017-06-23 ENCOUNTER — Other Ambulatory Visit: Payer: Self-pay

## 2017-06-23 ENCOUNTER — Ambulatory Visit (HOSPITAL_COMMUNITY)
Admission: EM | Admit: 2017-06-23 | Discharge: 2017-06-23 | Disposition: A | Discharge status: Home / Self Care | Payer: Medicaid Other | Attending: Family Medicine | Admitting: Family Medicine

## 2017-06-23 ENCOUNTER — Encounter (HOSPITAL_COMMUNITY): Payer: Self-pay | Admitting: Emergency Medicine

## 2017-06-23 DIAGNOSIS — N3090 Cystitis, unspecified without hematuria: Secondary | ICD-10-CM | POA: Insufficient documentation

## 2017-06-23 DIAGNOSIS — Z79899 Other long term (current) drug therapy: Secondary | ICD-10-CM | POA: Diagnosis not present

## 2017-06-23 DIAGNOSIS — Z87891 Personal history of nicotine dependence: Secondary | ICD-10-CM | POA: Diagnosis not present

## 2017-06-23 DIAGNOSIS — Z8249 Family history of ischemic heart disease and other diseases of the circulatory system: Secondary | ICD-10-CM | POA: Diagnosis not present

## 2017-06-23 DIAGNOSIS — N898 Other specified noninflammatory disorders of vagina: Secondary | ICD-10-CM

## 2017-06-23 DIAGNOSIS — Z3202 Encounter for pregnancy test, result negative: Secondary | ICD-10-CM

## 2017-06-23 LAB — POCT URINALYSIS DIP (DEVICE)
BILIRUBIN URINE: NEGATIVE
Glucose, UA: NEGATIVE mg/dL
Hgb urine dipstick: NEGATIVE
Ketones, ur: NEGATIVE mg/dL
NITRITE: POSITIVE — AB
PH: 6.5 (ref 5.0–8.0)
Protein, ur: NEGATIVE mg/dL
Specific Gravity, Urine: >=1.03 (ref 1.005–1.030)
UROBILINOGEN UA: 0.2 mg/dL (ref 0.0–1.0)

## 2017-06-23 LAB — POCT PREGNANCY, URINE: PREG TEST UR: NEGATIVE

## 2017-06-23 MED ORDER — CEPHALEXIN 500 MG PO CAPS: 500.0000 mg | ORAL_CAPSULE | Freq: Two times a day (BID) | ORAL | 0 refills | Status: AC

## 2017-06-23 MED ORDER — FLUCONAZOLE 150 MG PO TABS: 150.0000 mg | ORAL_TABLET | Freq: Every day | ORAL | 0 refills | Status: DC

## 2017-06-23 NOTE — Discharge Instructions (Signed)
Your urine was positive for an urinary tract infection. Start Keflex as directed. I have also called in diflucan for yeast infection, if you develop clumpy "cottage cheese like" discharge with vaginal itching while on keflex, you can fill the diflucan for yeast infection. Urine culture and testing sent, you will be contacted with any positive results that requires further treatment. Refrain from sexual activity for the next 7 days. Monitor for any worsening of symptoms, fever, abdominal pain, nausea, vomiting, to follow up for reevaluation. Keep hydrated, your urine should be clear to pale yellow in color. Monitor for any worsening of symptoms, fever, worsening abdominal pain, nausea/vomiting, flank pain, follow up for reevaluation.

## 2017-06-23 NOTE — ED Triage Notes (Signed)
Pt here vaginal irritation and urine odor.  She tested positive for BV when she was here on 05/16/17 for irregular bleeding.  Pt was not treated at the time.

## 2017-06-23 NOTE — ED Provider Notes (Signed)
[facility]    CSN: [id number] Arrival date & time: 06/23/17  1220     History   Chief Complaint Chief Complaint  Patient presents with  . vaginal irritation    HPI Melody Patrick is a 22 y.o. female.   22 year old female comes in for a 1 week history of vaginal irritation and urinary urgency with strong odor.  She states she was here about a month ago, at that time she was tested positive for BV, without treatment, but vaginal irritation resolved.  States vaginal irritation started prior to urinary symptoms, but slowly improved until onset of urinary symptoms.  She denies any dysuria, hematuria.  Denies abdominal pain.  She states she had vomiting yesterday associated with the food she ate, nausea and vomiting has since resolved.  Denies fever, chills, night sweats.  Denies vaginal discharge, spotting, lesions.  She is sexually active with one partner, no condom use.  Denies other birth control use.      Past Medical History:  Diagnosis Date  . Medical history non-contributory     Patient Active Problem List   Diagnosis Date Noted  . On Depo-Provera for contraception 10/09/2016    Past Surgical History:  Procedure Laterality Date  . WISDOM TOOTH EXTRACTION      OB History    Gravida Para Term Preterm AB Living   1 1 1     1    SAB TAB Ectopic Multiple Live Births         0 1       Home Medications    Prior to Admission medications   Medication Sig Start Date End Date Taking? Authorizing Provider  cephALEXin (KEFLEX) 500 MG capsule Take 1 capsule (500 mg total) by mouth 2 (two) times daily for 5 days. 06/23/17 06/28/17  Belinda Fisher, PA-C  fluconazole (DIFLUCAN) 150 MG tablet Take 1 tablet (150 mg total) by mouth daily. Take second dose 72 hours later if symptoms still persists. 06/23/17   Belinda Fisher, PA-C    Family History Family History  Problem Relation Age of Onset  . Hypertension Maternal Grandmother     Social History Social History    Tobacco Use  . Smoking status: Former Smoker    Types: Cigarettes  . Smokeless tobacco: Never Used  Substance Use Topics  . Alcohol use: No  . Drug use: No     Allergies   Patient has no known allergies.   Review of Systems Review of Systems  Reason unable to perform ROS: See HPI as above.     Physical Exam Triage Vital Signs ED Triage Vitals [06/23/17 1239]  Enc Vitals Group     BP 123/73     Pulse Rate 65     Resp 16     Temp 98.1 F (36.7 C)     Temp Source Oral     SpO2 98 %     Weight      Height      Head Circumference      Peak Flow      Pain Score      Pain Loc      Pain Edu?      Excl. in GC?    No data found.  Updated Vital Signs BP 123/73 (BP Location: Left Arm)   Pulse 65   Temp 98.1 F (36.7 C) (Oral)   Resp 16   LMP  (LMP Unknown)   SpO2 98%   Physical Exam  Constitutional: She is oriented to person, place, and time. She appears well-developed and well-nourished. No distress.  HENT:  Head: Normocephalic and atraumatic.  Eyes: Conjunctivae are normal. Pupils are equal, round, and reactive to light.  Cardiovascular: Normal rate, regular rhythm and normal heart sounds. Exam reveals no gallop and no friction rub.  No murmur heard. Pulmonary/Chest: Effort normal and breath sounds normal. She has no wheezes. She has no rales.  Abdominal: Soft. Bowel sounds are normal. She exhibits no mass. There is no tenderness. There is no rebound, no guarding and no CVA tenderness.  Genitourinary:  Genitourinary Comments: Deferred.  Patient self swab for cytology.  Neurological: She is alert and oriented to person, place, and time.  Skin: Skin is warm and dry.  Psychiatric: She has a normal mood and affect. Her behavior is normal. Judgment normal.     UC Treatments / Results  Labs (all labs ordered are listed, but only abnormal results are displayed) Labs Reviewed  POCT URINALYSIS DIP (DEVICE) - Abnormal; Notable for the following components:       Result Value   Nitrite POSITIVE (*)    Leukocytes, UA TRACE (*)    All other components within normal limits  URINE CULTURE  POCT PREGNANCY, URINE  CERVICOVAGINAL ANCILLARY ONLY    EKG  EKG Interpretation None       Radiology No results found.  Procedures Procedures (including critical care time)  Medications Ordered in UC Medications - No data to display   Initial Impression / Assessment and Plan / UC Course  I have reviewed the triage vital signs and the nursing notes.  Pertinent labs & imaging results that were available during my care of the patient were reviewed by me and considered in my medical decision making (see chart for details).    Urine dipstick positive for UTI. Start antibiotics as directed. Diflucan called in, discussed symptoms of yeast infection, can fill if develop symptoms. Push fluids. Cytology sent, patient will be contacted with any positive results that require additional treatment. Patient to refrain from sexual activity for the next 7 days. Return precautions given.  Patient expresses understanding and agrees to plan.  Final Clinical Impressions(s) / UC Diagnoses   Final diagnoses:  Cystitis    ED Discharge Orders        Ordered    cephALEXin (KEFLEX) 500 MG capsule  2 times daily     06/23/17 1327    fluconazole (DIFLUCAN) 150 MG tablet  Daily     06/23/17 1327        Belinda FisherYu, Amy V, PA-C 06/23/17 1334

## 2017-06-24 LAB — CERVICOVAGINAL ANCILLARY ONLY
Bacterial vaginitis: POSITIVE — AB
CANDIDA VAGINITIS: NEGATIVE
Chlamydia: NEGATIVE
Neisseria Gonorrhea: NEGATIVE
Trichomonas: POSITIVE — AB

## 2017-06-25 ENCOUNTER — Telehealth (HOSPITAL_COMMUNITY): Payer: Self-pay | Admitting: Internal Medicine

## 2017-06-25 LAB — URINE CULTURE: Culture: >=100000 — AB

## 2017-06-25 MED ORDER — METRONIDAZOLE 500 MG PO TABS: 500.0000 mg | ORAL_TABLET | Freq: Two times a day (BID) | ORAL | 0 refills | Status: DC

## 2017-06-25 NOTE — Telephone Encounter (Signed)
Clinical staff, please let patient know that test for trichomonas was positive.  Rx metronidazole was sent to the pharmacy of record, [facility] on [address]. Please refrain from sexual intercourse for 7 days to give the medicine time to work.  Sexual partners need to be notified and tested/treated.  Condoms may reduce risk of reinfection.   Test for gardnerella (bacterial vaginosis) was also positive.  Rx for metronidazole will cover it. Urine culture final result still pending; rx cephalexin given at the urgent care visit.   Recheck for further evaluation if symptoms are not improving.  LM

## 2017-09-25 ENCOUNTER — Other Ambulatory Visit: Payer: Self-pay

## 2017-09-25 ENCOUNTER — Ambulatory Visit (HOSPITAL_COMMUNITY)
Admission: EM | Admit: 2017-09-25 | Discharge: 2017-09-25 | Disposition: A | Discharge status: Home / Self Care | Payer: Medicaid Other | Attending: Internal Medicine | Admitting: Internal Medicine

## 2017-09-25 ENCOUNTER — Encounter (HOSPITAL_COMMUNITY): Payer: Self-pay | Admitting: Emergency Medicine

## 2017-09-25 DIAGNOSIS — K0889 Other specified disorders of teeth and supporting structures: Secondary | ICD-10-CM

## 2017-09-25 MED ORDER — MELOXICAM 7.5 MG PO TABS: 7.5000 mg | ORAL_TABLET | Freq: Every day | ORAL | 0 refills | Status: DC

## 2017-09-25 MED ORDER — PENICILLIN V POTASSIUM 500 MG PO TABS: 500.0000 mg | ORAL_TABLET | Freq: Four times a day (QID) | ORAL | 0 refills | Status: AC

## 2017-09-25 NOTE — ED Triage Notes (Signed)
Patient reports entire mouth is hurting from a tooth-top, left

## 2017-09-25 NOTE — ED Provider Notes (Signed)
[facility]    CSN: [id number] Arrival date & time: 09/25/17  1746     History   Chief Complaint Chief Complaint  Patient presents with  . Dental Pain    HPI Melody Patrick is a 22 y.o. female.   22 year old female comes in for dental pain.  She has known cracked tooth of the left upper jaw, so dentist and is scheduling to get it pulled.  States the pain is usually controlled with ibuprofen, Tylenol, but has been worsening.  States she was told by her dentist that if the pain continues for 2 days, to follow-up for reevaluation.  Denies fever, chills, night sweats.  Denies facial swelling.  Denies swelling of the throat, trouble breathing, trouble swallowing, tripoding, drooling.     Past Medical History:  Diagnosis Date  . Medical history non-contributory     Patient Active Problem List   Diagnosis Date Noted  . On Depo-Provera for contraception 10/09/2016    Past Surgical History:  Procedure Laterality Date  . WISDOM TOOTH EXTRACTION      OB History    Gravida  1   Para  1   Term  1   Preterm      AB      Living  1     SAB      TAB      Ectopic      Multiple  0   Live Births  1            Home Medications    Prior to Admission medications   Medication Sig Start Date End Date Taking? Authorizing Provider  aspirin-acetaminophen-caffeine (EXCEDRIN MIGRAINE) [phone] MG tablet Take by mouth every 6 (six) hours as needed for headache.   Yes [provider]  Aspirin-Caffeine 845-65 MG PACK Take by mouth.   Yes [provider]  ibuprofen (ADVIL,MOTRIN) 200 MG tablet Take 200 mg by mouth every 6 (six) hours as needed.   Yes [provider]  fluconazole (DIFLUCAN) 150 MG tablet Take 1 tablet (150 mg total) by mouth daily. Take second dose 72 hours later if symptoms still persists. 06/23/17   Cathie HoopsYu,  V, PA-C  meloxicam (MOBIC) 7.5 MG tablet Take 1 tablet (7.5 mg total) by mouth daily. 09/25/17   Cathie HoopsYu,  V,  PA-C  metroNIDAZOLE (FLAGYL) 500 MG tablet Take 1 tablet (500 mg total) by mouth 2 (two) times daily. For bacterial vaginosis and positive trichomonas test. 06/25/17   Isa RankinMurray, Laura Wilson, MD  penicillin v potassium (VEETID) 500 MG tablet Take 1 tablet (500 mg total) by mouth 4 (four) times daily for 7 days. 09/25/17 10/02/17  Belinda FisherYu,  V, PA-C    Family History Family History  Problem Relation Age of Onset  . Hypertension Maternal Grandmother     Social History Social History   Tobacco Use  . Smoking status: Former Smoker    Types: Cigarettes  . Smokeless tobacco: Never Used  Substance Use Topics  . Alcohol use: No  . Drug use: No     Allergies   Patient has no known allergies.   Review of Systems Review of Systems  Reason unable to perform ROS: See HPI as above.     Physical Exam Triage Vital Signs ED Triage Vitals  Enc Vitals Group     BP 09/25/17 1947 123/85     Pulse Rate 09/25/17 1947 77     Resp 09/25/17 1947 18     Temp  09/25/17 1947 98.5 F (36.9 C)     Temp Source 09/25/17 1947 Oral     SpO2 09/25/17 1947 100 %     Weight --      Height --      Head Circumference --      Peak Flow --      Pain Score 09/25/17 1945 3     Pain Loc --      Pain Edu? --      Excl. in GC? --    No data found.  Updated Vital Signs BP 123/85 (BP Location: Left Arm)   Pulse 77   Temp 98.5 F (36.9 C) (Oral)   Resp 18   SpO2 100%   Physical Exam  Constitutional: She is oriented to person, place, and time. She appears well-developed and well-nourished. No distress.  HENT:  Head: Normocephalic and atraumatic.  Mouth/Throat: Uvula is midline, oropharynx is clear and moist and mucous membranes are normal.    Cracked tooth of the left upper jaw.  Tenderness to palpation on surrounding gums.  No obvious fluctuance.  Floor of mouth soft to palpation.  No facial swelling.  Eyes: Pupils are equal, round, and reactive to light. Conjunctivae are normal.  Neurological: She is  alert and oriented to person, place, and time.     UC Treatments / Results  Labs (all labs ordered are listed, but only abnormal results are displayed) Labs Reviewed - No data to display  EKG None Radiology No results found.  Procedures Procedures (including critical care time)  Medications Ordered in UC Medications - No data to display   Initial Impression / Assessment and Plan / UC Course  I have reviewed the triage vital signs and the nursing notes.  Pertinent labs & imaging results that were available during my care of the patient were reviewed by me and considered in my medical decision making (see chart for details).    Start antibiotics for possible dental infection. Symptomatic treatment as needed. Discussed with patient symptoms can return if dental problem is not addressed. Follow up with dentist for further evaluation and treatment of dental pain. Resources given. Return precautions given.    Final Clinical Impressions(s) / UC Diagnoses   Final diagnoses:  Pain, dental    ED Discharge Orders        Ordered    penicillin v potassium (VEETID) 500 MG tablet  4 times daily     09/25/17 2020    meloxicam (MOBIC) 7.5 MG tablet  Daily     09/25/17 2020        Belinda Fisher, PA-C 09/25/17 2025

## 2017-09-25 NOTE — Discharge Instructions (Addendum)
Start Penicillin as directed for dental abscess. Mobic for pain. Follow up with dentist for further treatment and evaluation. If experiencing swelling of the throat, trouble breathing, trouble swallowing, leaning forward to breath, drooling, go to the emergency department for further evaluation.

## 2018-03-10 ENCOUNTER — Encounter (HOSPITAL_COMMUNITY): Payer: Self-pay | Admitting: Emergency Medicine

## 2018-03-10 ENCOUNTER — Ambulatory Visit (HOSPITAL_COMMUNITY)
Admission: EM | Admit: 2018-03-10 | Discharge: 2018-03-10 | Disposition: A | Discharge status: Home / Self Care | Payer: Medicaid Other | Attending: Family Medicine | Admitting: Family Medicine

## 2018-03-10 DIAGNOSIS — R197 Diarrhea, unspecified: Secondary | ICD-10-CM

## 2018-03-10 NOTE — Discharge Instructions (Addendum)
I believe the diarrhea will resolve on its own Just make sure you are staying hydrated For worsening symptoms to include severe abdominal pain, fever, nausea, vomiting please return Otherwise nothing concerning on exam today.

## 2018-03-10 NOTE — ED Triage Notes (Signed)
Pt here for abd cramping and possible bleeding with BM

## 2018-03-10 NOTE — ED Provider Notes (Signed)
[facility]    CSN: [id number] Arrival date & time: 03/10/18  1107     History   Chief Complaint Chief Complaint  Patient presents with  . Rectal Bleeding    HPI Melody Patrick is a 22 y.o. female.    Diarrhea  Quality:  Semi-solid (1 episode with blood but that resolved. ) Severity:  Mild Onset quality:  Gradual Number of episodes:  4 per day Duration:  1 week Timing:  Sporadic Progression:  Unchanged Relieved by:  None tried Worsened by:  Nothing Ineffective treatments:  None tried Associated symptoms: no abdominal pain, no arthralgias, no chills, no recent cough, no diaphoresis, no fever, no headaches, no myalgias, no URI and no vomiting   Risk factors: no recent antibiotic use, no sick contacts, no suspicious food intake and no travel to endemic areas     Past Medical History:  Diagnosis Date  . Medical history non-contributory     Patient Active Problem List   Diagnosis Date Noted  . On Depo-Provera for contraception 10/09/2016    Past Surgical History:  Procedure Laterality Date  . WISDOM TOOTH EXTRACTION      OB History    Gravida  1   Para  1   Term  1   Preterm      AB      Living  1     SAB      TAB      Ectopic      Multiple  0   Live Births  1            Home Medications    Prior to Admission medications   Medication Sig Start Date End Date Taking? Authorizing Provider  aspirin-acetaminophen-caffeine (EXCEDRIN MIGRAINE) [phone] MG tablet Take by mouth every 6 (six) hours as needed for headache.    [provider]  Aspirin-Caffeine 845-65 MG PACK Take by mouth.    [provider]  fluconazole (DIFLUCAN) 150 MG tablet Take 1 tablet (150 mg total) by mouth daily. Take second dose 72 hours later if symptoms still persists. Patient not taking: Reported on 03/10/2018 06/23/17   Belinda Fisher, PA-C  ibuprofen (ADVIL,MOTRIN) 200 MG tablet Take 200 mg by mouth every 6 (six) hours as needed.     [provider]  meloxicam (MOBIC) 7.5 MG tablet Take 1 tablet (7.5 mg total) by mouth daily. Patient not taking: Reported on 03/10/2018 09/25/17   Belinda Fisher, PA-C  metroNIDAZOLE (FLAGYL) 500 MG tablet Take 1 tablet (500 mg total) by mouth 2 (two) times daily. For bacterial vaginosis and positive trichomonas test. Patient not taking: Reported on 03/10/2018 06/25/17   Isa Rankin, MD    Family History Family History  Problem Relation Age of Onset  . Hypertension Maternal Grandmother     Social History Social History   Tobacco Use  . Smoking status: Former Smoker    Types: Cigarettes  . Smokeless tobacco: Never Used  Substance Use Topics  . Alcohol use: No  . Drug use: No     Allergies   Patient has no known allergies.   Review of Systems Review of Systems  Constitutional: Negative for chills, diaphoresis and fever.  Gastrointestinal: Positive for diarrhea. Negative for abdominal pain and vomiting.  Musculoskeletal: Negative for arthralgias and myalgias.  Neurological: Negative for headaches.     Physical Exam Triage Vital Signs ED Triage Vitals [03/10/18 1133]  Enc Vitals Group     BP 121/72  Pulse Rate 82     Resp 18     Temp 98 F (36.7 C)     Temp Source Oral     SpO2 100 %     Weight      Height      Head Circumference      Peak Flow      Pain Score      Pain Loc      Pain Edu?      Excl. in GC?    No data found.  Updated Vital Signs BP 121/72 (BP Location: Left Arm)   Pulse 82   Temp 98 F (36.7 C) (Oral)   Resp 18   SpO2 100%   Visual Acuity Right Eye Distance:   Left Eye Distance:   Bilateral Distance:    Right Eye Near:   Left Eye Near:    Bilateral Near:     Physical Exam   UC Treatments / Results  Labs (all labs ordered are listed, but only abnormal results are displayed) Labs Reviewed - No data to display  EKG None  Radiology No results found.  Procedures Procedures (including critical care  time)  Medications Ordered in UC Medications - No data to display  Initial Impression / Assessment and Plan / UC Course  I have reviewed the triage vital signs and the nursing notes.  Pertinent labs & imaging results that were available during my care of the patient were reviewed by me and considered in my medical decision making (see chart for details).    Self limiting illness Could be viral or diet related. No acute findings on exam that are worrisome Vital signs stable, nontoxic or ill-appearing She is not having any abdominal pain Instructed to stay hydrated and limit spicy, greasy foods until the diarrhea resolves.  Final Clinical Impressions(s) / UC Diagnoses   Final diagnoses:  Diarrhea, unspecified type     Discharge Instructions     I believe the diarrhea will resolve on its own Just make sure you are staying hydrated For worsening symptoms to include severe abdominal pain, fever, nausea, vomiting please return Otherwise nothing concerning on exam today.   ED Prescriptions    None     Controlled Substance Prescriptions [place] Controlled Substance Registry consulted? Not Applicable   Janace ArisBast, Ravynn Hogate A, NP 03/10/18 1233

## 2018-03-17 ENCOUNTER — Ambulatory Visit (HOSPITAL_COMMUNITY)
Admission: EM | Admit: 2018-03-17 | Discharge: 2018-03-17 | Disposition: A | Discharge status: Home / Self Care | Payer: Self-pay | Attending: Family Medicine | Admitting: Family Medicine

## 2018-03-17 ENCOUNTER — Encounter (HOSPITAL_COMMUNITY): Payer: Self-pay | Admitting: Emergency Medicine

## 2018-03-17 ENCOUNTER — Other Ambulatory Visit: Payer: Self-pay

## 2018-03-17 DIAGNOSIS — R1030 Lower abdominal pain, unspecified: Secondary | ICD-10-CM

## 2018-03-17 MED ORDER — DICYCLOMINE HCL 20 MG PO TABS: 20.0000 mg | ORAL_TABLET | Freq: Two times a day (BID) | ORAL | 0 refills | Status: DC

## 2018-03-17 NOTE — ED Provider Notes (Signed)
[facility]    CSN: [id number] Arrival date & time: 03/17/18  1022     History   Chief Complaint Chief Complaint  Patient presents with  . Abdominal Pain    HPI Melody Patrick is a 22 y.o. female no significant past medical history presenting today for evaluation of abdominal cramping and changes to her bowel movements.  Patient states that over the past 1 to 2 weeks she has had frequent bowel movements.  States that her bowel movements are soft and in pieces.  She has not had a fall from stool.  Denies diarrhea/watery bowel movements.  She also notes to have occasional mucus and small amount of blood.  States that she is going to the bathroom approximately 10 times a day with the sensation to have a bowel movement, but does not always have a bowel movement.  Abdominal pain is only associated with the sensation to have a bowel movement and resolves after straining.  Denies rectal pain.  She is concerned that this could be related to recent use of BC powder, Goody's powder and naproxen and Excedrin.  She denies pain with eating, she is eating and drinking like normally.  She denies urinary symptoms.  Denies vaginal discharge, bleeding or pelvic pain.  Denies nausea or vomiting.  Denies fevers.  HPI  Past Medical History:  Diagnosis Date  . Medical history non-contributory     Patient Active Problem List   Diagnosis Date Noted  . On Depo-Provera for contraception 10/09/2016    Past Surgical History:  Procedure Laterality Date  . WISDOM TOOTH EXTRACTION      OB History    Gravida  1   Para  1   Term  1   Preterm      AB      Living  1     SAB      TAB      Ectopic      Multiple  0   Live Births  1            Home Medications    Prior to Admission medications   Medication Sig Start Date End Date Taking? Authorizing Provider  aspirin-acetaminophen-caffeine (EXCEDRIN MIGRAINE) [phone] MG tablet Take by mouth every 6 (six) hours as  needed for headache.    [provider]  Aspirin-Caffeine 845-65 MG PACK Take by mouth.    [provider]  dicyclomine (BENTYL) 20 MG tablet Take 1 tablet (20 mg total) by mouth 2 (two) times daily. 03/17/18   Ernie Kasler C, PA-C  ibuprofen (ADVIL,MOTRIN) 200 MG tablet Take 200 mg by mouth every 6 (six) hours as needed.    [provider]    Family History Family History  Problem Relation Age of Onset  . Hypertension Maternal Grandmother     Social History Social History   Tobacco Use  . Smoking status: Former Smoker    Types: Cigarettes  . Smokeless tobacco: Never Used  Substance Use Topics  . Alcohol use: No  . Drug use: No     Allergies   Patient has no known allergies.   Review of Systems Review of Systems  Constitutional: Negative for fever.  Respiratory: Negative for shortness of breath.   Cardiovascular: Negative for chest pain.  Gastrointestinal: Positive for abdominal pain and blood in stool. Negative for constipation, diarrhea, nausea, rectal pain and vomiting.  Genitourinary: Negative for dysuria, flank pain, genital sores, hematuria, menstrual problem, vaginal bleeding, vaginal discharge and  vaginal pain.  Musculoskeletal: Negative for back pain.  Skin: Negative for rash.  Neurological: Negative for dizziness, light-headedness and headaches.     Physical Exam Triage Vital Signs ED Triage Vitals  Enc Vitals Group     BP 03/17/18 1113 (!) 120/91     Pulse Rate 03/17/18 1113 84     Resp 03/17/18 1113 18     Temp 03/17/18 1113 98 F (36.7 C)     Temp src --      SpO2 03/17/18 1113 100 %     Weight --      Height --      Head Circumference --      Peak Flow --      Pain Score 03/17/18 1111 10     Pain Loc --      Pain Edu? --      Excl. in GC? --    No data found.  Updated Vital Signs BP (!) 120/91 (BP Location: Right Arm)   Pulse 84   Temp 98 F (36.7 C)   Resp 18   LMP 03/14/2018   SpO2 100%   Visual  Acuity Right Eye Distance:   Left Eye Distance:   Bilateral Distance:    Right Eye Near:   Left Eye Near:    Bilateral Near:     Physical Exam  Constitutional: She appears well-developed and well-nourished. No distress.  HENT:  Head: Normocephalic and atraumatic.  Oral mucosa pink and moist, no tonsillar enlargement or exudate. Posterior pharynx patent and nonerythematous, no uvula deviation or swelling. Normal phonation.   Eyes: Conjunctivae are normal.  Neck: Neck supple.  Cardiovascular: Normal rate and regular rhythm.  No murmur heard. Pulmonary/Chest: Effort normal and breath sounds normal. No respiratory distress.  Breathing comfortably at rest, CTABL, no wheezing, rales or other adventitious sounds auscultated  Abdominal: Soft. There is no tenderness.  Abdomen soft, nondistended, bowel sounds present throughout all 4 quadrants, nontender to light deep palpation throughout all 4 quadrants, epigastrium and suprapubic area. Negative CVA tenderness  Musculoskeletal: She exhibits no edema.  Neurological: She is alert.  Skin: Skin is warm and dry.  Psychiatric: She has a normal mood and affect.  Nursing note and vitals reviewed.    UC Treatments / Results  Labs (all labs ordered are listed, but only abnormal results are displayed) Labs Reviewed - No data to display  EKG None  Radiology No results found.  Procedures Procedures (including critical care time)  Medications Ordered in UC Medications - No data to display  Initial Impression / Assessment and Plan / UC Course  I have reviewed the triage vital signs and the nursing notes.  Pertinent labs & imaging results that were available during my care of the patient were reviewed by me and considered in my medical decision making (see chart for details).     Patient with increased frequency of bowel movements/sensation of having bowel movements.  Symptoms possible irritable bowel, will provide Bentyl to help with  abdominal spasms, discussed following up with gastroenterology if symptoms persisting.  Feels it is less likely related to constipation.  Abdominal exam unremarkable today.  Have patient follow-up if pain worsening, changing, not having bowel movement for 2 to 3 days, fever.Discussed strict return precautions. Patient verbalized understanding and is agreeable with plan.  Final Clinical Impressions(s) / UC Diagnoses   Final diagnoses:  Lower abdominal pain     Discharge Instructions     I do not suspect the changes  in her bowel movements to be related to taking anti-inflammatories for an infectious cause Your symptoms seem to most closely line up with possible irritable bowel Please begin taking Bentyl twice daily for the next 10 days to see if this helps with the discomfort and frequency of your abdominal discomfort Please also try to increase fiber in your diet, please see attached information Follow-up with gastroenterology if symptoms persisting Return here emergency room if developing worsening pain, fever, vomiting, not having a bowel movement for 2 to 3 days   ED Prescriptions    Medication Sig Dispense Auth. Provider   dicyclomine (BENTYL) 20 MG tablet Take 1 tablet (20 mg total) by mouth 2 (two) times daily. 20 tablet Carleena Mires, [place] C, PA-C     Controlled Substance Prescriptions  Controlled Substance Registry consulted? Not Applicable   Lew Dawes, [place] 03/17/18 1232

## 2018-03-17 NOTE — ED Triage Notes (Signed)
Only having pain with bowel movements.  Stool has changed in consistency.  bm is pieces of stool.  Patient concerned this is related to excedrin pain medicine she took several weeks ago for a tooth ache.

## 2018-03-17 NOTE — Discharge Instructions (Signed)
I do not suspect the changes in her bowel movements to be related to taking anti-inflammatories for an infectious cause Your symptoms seem to most closely line up with possible irritable bowel Please begin taking Bentyl twice daily for the next 10 days to see if this helps with the discomfort and frequency of your abdominal discomfort Please also try to increase fiber in your diet, please see attached information Follow-up with gastroenterology if symptoms persisting Return here emergency room if developing worsening pain, fever, vomiting, not having a bowel movement for 2 to 3 days

## 2018-06-06 ENCOUNTER — Ambulatory Visit (HOSPITAL_COMMUNITY)
Admission: EM | Admit: 2018-06-06 | Discharge: 2018-06-06 | Disposition: A | Discharge status: Home / Self Care | Payer: Medicaid Other | Attending: Family Medicine | Admitting: Family Medicine

## 2018-06-06 ENCOUNTER — Other Ambulatory Visit: Payer: Self-pay

## 2018-06-06 ENCOUNTER — Encounter (HOSPITAL_COMMUNITY): Payer: Self-pay

## 2018-06-06 DIAGNOSIS — L089 Local infection of the skin and subcutaneous tissue, unspecified: Secondary | ICD-10-CM | POA: Insufficient documentation

## 2018-06-06 DIAGNOSIS — B9689 Other specified bacterial agents as the cause of diseases classified elsewhere: Secondary | ICD-10-CM | POA: Insufficient documentation

## 2018-06-06 DIAGNOSIS — G501 Atypical facial pain: Secondary | ICD-10-CM

## 2018-06-06 MED ORDER — SULFAMETHOXAZOLE-TRIMETHOPRIM 800-160 MG PO TABS: 1.0000 | ORAL_TABLET | Freq: Two times a day (BID) | ORAL | 0 refills | Status: AC

## 2018-06-06 NOTE — ED Triage Notes (Signed)
Pt cc toothache and abscess x 2 weeks.

## 2018-06-06 NOTE — Discharge Instructions (Addendum)
You may use over the counter ibuprofen or acetaminophen as needed.  ° °

## 2018-06-08 NOTE — ED Provider Notes (Signed)
[facility]   [id number] 06/06/18 Arrival Time: 1124  ASSESSMENT & PLAN:  1. Superficial bacterial skin infection   Question early cellulitis/abscess vs sialoadenitis. Discussed.   Meds ordered this encounter  Medications  . sulfamethoxazole-trimethoprim (BACTRIM DS,SEPTRA DS) 800-160 MG tablet    Sig: Take 1 tablet by mouth 2 (two) times daily for 10 days.    Dispense:  20 tablet    Refill:  0   Dental resource written instructions given. She will schedule dental evaluation as soon as possible.  Reviewed expectations re: course of current medical issues. Questions answered. Outlined signs and symptoms indicating need for more acute intervention. Patient verbalized understanding. After Visit Summary given.   SUBJECTIVE:  Melody Patrick is a 22 y.o. female who reports gradual onset of left lower cheek vs dental pain described as "feeling a little swollen and getting a little bit more painful". Noticed over the past week. Fever: absent. Tolerating PO intake but reports pain with chewing. Normal swallowing. She does not see a dentist regularly. No neck swelling or pain. OTC analgesics without relief.  ROS: As per HPI.  OBJECTIVE:  Vitals:   06/06/18 1245 06/06/18 1251  BP: (!) 110/59   Pulse: 74   Resp: 16   Temp: 98.1 F (36.7 C)   TempSrc: Oral   SpO2: 98%   Weight:  71.7 kg    General appearance: alert; no distress HENT: normocephalic; atraumatic; dentition: fair; R lower gums appear normal and are without tenderness; without areas of fluctuance; R lower cheek with the slightest skin thickening; no external wounds; normal jaw movement without pain Neck: supple without LAD; FROM; trachea midline Lungs: normal respirations; unlabored Skin: warm and dry Psychological: alert and cooperative; normal mood and affect  No Known Allergies  Past Medical History:  Diagnosis Date  . Medical history non-contributory    Social History   Socioeconomic  History  . Marital status: Single    Spouse name: Not on file  . Number of children: Not on file  . Years of education: Not on file  . Highest education level: Not on file  Occupational History  . Not on file  Social Needs  . Financial resource strain: Not on file  . Food insecurity:    Worry: Not on file    Inability: Not on file  . Transportation needs:    Medical: Not on file    Non-medical: Not on file  Tobacco Use  . Smoking status: Former Smoker    Types: Cigarettes  . Smokeless tobacco: Never Used  Substance and Sexual Activity  . Alcohol use: No  . Drug use: No  . Sexual activity: Yes    Birth control/protection: None  Lifestyle  . Physical activity:    Days per week: Not on file    Minutes per session: Not on file  . Stress: Not on file  Relationships  . Social connections:    Talks on phone: Not on file    Gets together: Not on file    Attends religious service: Not on file    Active member of club or organization: Not on file    Attends meetings of clubs or organizations: Not on file    Relationship status: Not on file  . Intimate partner violence:    Fear of current or ex partner: Not on file    Emotionally abused: Not on file    Physically abused: Not on file    Forced sexual activity: Not on file  Other Topics Concern  . Not on file  Social History Narrative  . Not on file   Family History  Problem Relation Age of Onset  . Hypertension Maternal Grandmother    Past Surgical History:  Procedure Laterality Date  . WISDOM TOOTH EXTRACTION       Melody Patrick, Melody [place], MD 06/13/18 [phone]

## 2018-08-31 ENCOUNTER — Other Ambulatory Visit: Payer: Self-pay

## 2018-08-31 ENCOUNTER — Ambulatory Visit (HOSPITAL_COMMUNITY)
Admission: EM | Admit: 2018-08-31 | Discharge: 2018-08-31 | Disposition: A | Discharge status: Home / Self Care | Payer: Medicaid Other | Attending: Family Medicine | Admitting: Family Medicine

## 2018-08-31 ENCOUNTER — Encounter (HOSPITAL_COMMUNITY): Payer: Self-pay | Admitting: Emergency Medicine

## 2018-08-31 DIAGNOSIS — M25562 Pain in left knee: Secondary | ICD-10-CM

## 2018-08-31 DIAGNOSIS — S8002XA Contusion of left knee, initial encounter: Secondary | ICD-10-CM

## 2018-08-31 DIAGNOSIS — G501 Atypical facial pain: Secondary | ICD-10-CM

## 2018-08-31 MED ORDER — IBUPROFEN 600 MG PO TABS: 600.0000 mg | ORAL_TABLET | Freq: Four times a day (QID) | ORAL | 0 refills | Status: DC | PRN

## 2018-08-31 MED ORDER — CYCLOBENZAPRINE HCL 5 MG PO TABS: 5.0000 mg | ORAL_TABLET | Freq: Two times a day (BID) | ORAL | 0 refills | Status: DC | PRN
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
## 2018-09-06 ENCOUNTER — Ambulatory Visit (HOSPITAL_COMMUNITY)
Admission: EM | Admit: 2018-09-06 | Discharge: 2018-09-06 | Disposition: A | Discharge status: Home / Self Care | Payer: Self-pay | Attending: Internal Medicine | Admitting: Internal Medicine

## 2018-09-06 ENCOUNTER — Ambulatory Visit (INDEPENDENT_AMBULATORY_CARE_PROVIDER_SITE_OTHER): Payer: Self-pay

## 2018-09-06 ENCOUNTER — Other Ambulatory Visit: Payer: Self-pay

## 2018-09-06 ENCOUNTER — Encounter (HOSPITAL_COMMUNITY): Payer: Self-pay | Admitting: *Deleted

## 2018-09-06 DIAGNOSIS — S8010XA Contusion of unspecified lower leg, initial encounter: Secondary | ICD-10-CM

## 2018-09-06 DIAGNOSIS — M79605 Pain in left leg: Secondary | ICD-10-CM
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
## 2018-09-06 NOTE — Discharge Instructions (Addendum)
Now is that past 48 hour from the injury, you may use heat for 15 minutes 2-4 times a day to help the hardness of the soft tissue blood soften up and eventually will resolve. Sometimes may take several months.

## 2018-12-01 DIAGNOSIS — Z3009 Encounter for other general counseling and advice on contraception: Secondary | ICD-10-CM | POA: Diagnosis not present

## 2018-12-01 DIAGNOSIS — Z3200 Encounter for pregnancy test, result unknown: Secondary | ICD-10-CM | POA: Diagnosis not present

## 2018-12-05 ENCOUNTER — Encounter (HOSPITAL_COMMUNITY): Payer: Self-pay | Admitting: Family Medicine

## 2018-12-05 ENCOUNTER — Inpatient Hospital Stay (HOSPITAL_COMMUNITY)
Admission: AD | Admit: 2018-12-05 | Discharge: 2018-12-05 | Disposition: A | Discharge status: Home / Self Care | Payer: Medicaid Other | Attending: Obstetrics and Gynecology | Admitting: Obstetrics and Gynecology

## 2018-12-05 ENCOUNTER — Ambulatory Visit (HOSPITAL_COMMUNITY)
Admission: EM | Admit: 2018-12-05 | Discharge: 2018-12-05 | Disposition: A | Discharge status: Home / Self Care | Payer: Medicaid Other | Attending: Internal Medicine | Admitting: Internal Medicine

## 2018-12-05 ENCOUNTER — Other Ambulatory Visit: Payer: Self-pay

## 2018-12-05 DIAGNOSIS — Z711 Person with feared health complaint in whom no diagnosis is made: Secondary | ICD-10-CM

## 2018-12-05 DIAGNOSIS — N939 Abnormal uterine and vaginal bleeding, unspecified: Secondary | ICD-10-CM | POA: Insufficient documentation

## 2018-12-05 DIAGNOSIS — Z3A00 Weeks of gestation of pregnancy not specified: Secondary | ICD-10-CM | POA: Insufficient documentation

## 2018-12-05 DIAGNOSIS — O26899 Other specified pregnancy related conditions, unspecified trimester: Secondary | ICD-10-CM | POA: Insufficient documentation

## 2018-12-05 DIAGNOSIS — O039 Complete or unspecified spontaneous abortion without complication: Secondary | ICD-10-CM | POA: Insufficient documentation

## 2018-12-05 LAB — HCG, QUANTITATIVE, PREGNANCY: hCG, Beta Chain, Quant, S: 6 m[IU]/mL — ABNORMAL HIGH (ref <5)

## 2018-12-05 LAB — POCT PREGNANCY, URINE: Preg Test, Ur: NEGATIVE

## 2018-12-05 NOTE — ED Provider Notes (Signed)
[facility]    CSN: [id number] Arrival date & time: 12/05/18  1008      History   Chief Complaint Chief Complaint  Patient presents with  . bleeding pregnant    HPI Melody Patrick is a 23 y.o. female.   HPI Patient presents today with concern for pregnancy with current bleeding. Patient's last menstrual period was 10/30/2018.  She reports obtaining two pregnancy this week, one at the health department resulting in two positive pregnancy test. She is bleeding today.  Urine pregnancy test here in clinic today is negative.  History reviewed. No pertinent past medical history.  Patient Active Problem List   Diagnosis Date Noted  . On Depo-Provera for contraception 10/09/2016    Past Surgical History:  Procedure Laterality Date  . WISDOM TOOTH EXTRACTION      OB History    Gravida  1   Para  1   Term  1   Preterm      AB      Living  1     SAB      TAB      Ectopic      Multiple  0   Live Births  1            Home Medications    Prior to Admission medications   Medication Sig Start Date End Date Taking? Authorizing Provider  aspirin-acetaminophen-caffeine (EXCEDRIN MIGRAINE) [phone] MG tablet Take by mouth every 6 (six) hours as needed for headache.    [provider]  Aspirin-Caffeine 845-65 MG PACK Take by mouth.    [provider]  cyclobenzaprine (FLEXERIL) 5 MG tablet Take 1-2 tablets (5-10 mg total) by mouth 2 (two) times daily as needed for muscle spasms. 08/31/18   Wieters, Hallie C, PA-C  dicyclomine (BENTYL) 20 MG tablet Take 1 tablet (20 mg total) by mouth 2 (two) times daily. 03/17/18   Wieters, Hallie C, PA-C  ibuprofen (ADVIL,MOTRIN) 600 MG tablet Take 1 tablet (600 mg total) by mouth every 6 (six) hours as needed. 08/31/18   Wieters, Elesa Hacker, PA-C    Family History Family History  Problem Relation Age of Onset  . Hypertension Maternal Grandmother   . Healthy Mother     Social History  Social History   Tobacco Use  . Smoking status: Former Smoker    Types: Cigarettes  . Smokeless tobacco: Never Used  Substance Use Topics  . Alcohol use: Yes    Comment: occasionally  . Drug use: Yes    Types: Marijuana     Allergies   Patient has no known allergies.   Review of Systems Review of Systems Pertinent negatives listed in HPI Physical Exam Triage Vital Signs ED Triage Vitals  Enc Vitals Group     BP 12/05/18 1031 116/74     Pulse Rate 12/05/18 1031 70     Resp 12/05/18 1031 16     Temp 12/05/18 1031 99.1 F (37.3 C)     Temp Source 12/05/18 1031 Oral     SpO2 12/05/18 1031 98 %     Weight 12/05/18 1030 160 lb (72.6 kg)     Height --      Head Circumference --      Peak Flow --      Pain Score 12/05/18 1029 4     Pain Loc --      Pain Edu? --      Excl. in GC? --    No  data found.  Updated Vital Signs BP 116/74 (BP Location: Right Arm)   Pulse 70   Temp 99.1 F (37.3 C) (Oral)   Resp 16   Wt 160 lb (72.6 kg)   LMP 10/30/2018   SpO2 98%   BMI 28.34 kg/m   Visual Acuity Right Eye Distance:   Left Eye Distance:   Bilateral Distance:    Right Eye Near:   Left Eye Near:    Bilateral Near:     Physical Exam General appearance: alert, well developed, well nourished, cooperative and in no distress Head: Normocephalic, without obvious abnormality, atraumatic Respiratory: Respirations even and unlabored, normal respiratory rate Heart: rate and rhythm normal. No gallop or murmurs noted on exam  Extremities: No gross deformities Skin: Skin color, texture, turgor normal. No rashes seen  Psych: Appropriate mood and affect. Neurologic: Mental status: Alert, oriented to person, place, and time, thought content appropriate. UC Treatments / Results  Labs (all labs ordered are listed, but only abnormal results are displayed) Labs Reviewed  POCT PREGNANCY, URINE    EKG None  Radiology No results found.  Procedures Procedures (including  critical care time)  Medications Ordered in UC Medications - No data to display  Initial Impression / Assessment and Plan / UC Course  I have reviewed the triage vital signs and the nursing notes.  Pertinent labs & imaging results that were available during my care of the patient were reviewed by me and considered in my medical decision making (see chart for details).   Patient presents today with concern for possible loss of pregnancy secondary to current bleeding.  She reports she has had 2+ pregnancy test this week.  Urine pregnancy today is negative.  Recommend patient [facility] for serum hCG testing and possible ultrasound to confirm pregnancy.  Suspect patient is experiencing of spontaneous abortion. She is without distress although is concerned given recently positive two pregnancy test.  Recommended follow-up with an OB/GYN after evaluation at the ER. Patient verbalized agreement and understanding with plan.  Final Clinical Impressions(s) / UC Diagnoses   Final diagnoses:  Vaginal bleeding  Concern about losing pregnancy without diagnosis     Discharge Instructions     Please follow-up at the ER for blood pregnancy test and possible imaging.     ED Prescriptions    None     Controlled Substance Prescriptions [place] Controlled Substance Registry consulted? Not Applicable   Bing NeighborsHarris, Ethridge Sollenberger S, FNP 12/05/18 1057

## 2018-12-05 NOTE — MAU Note (Signed)
Pt called, not in lobby 

## 2018-12-05 NOTE — ED Triage Notes (Signed)
Pt states she is [redacted] weeks pregnant. Pt states she was told by the health department she was pregnant. Pt states she has been bleeding sense yesterday. Pt states the blood is like she's having a period.

## 2018-12-05 NOTE — MAU Note (Signed)
Melody Patrick is a 23 y.o. here in MAU reporting: had faint positive UPT jun 14 and was confirmed by the HD on jun 16. For the past couple of days she has intermittently had some pain/bleeding. This AM woke up with a large spot of blood in her panties. Now she is having bleeding like a period. No pain.  LMP: 10/25/18  Onset of complaint: the past few days  Pain score: 0/10  Vitals:   12/05/18 1242  BP: 118/78  Pulse: 74  Resp: 16  Temp: 98.2 F (36.8 C)  SpO2: 100%      Lab orders placed from triage: hcg

## 2018-12-05 NOTE — Discharge Instructions (Signed)
Miscarriage  A miscarriage is the loss of an unborn baby (fetus) before the 20th week of pregnancy. Most miscarriages happen during the first 3 months of pregnancy. Sometimes, a miscarriage can happen before a woman knows that she is pregnant.  Having a miscarriage can be an emotional experience. If you have had a miscarriage, talk with your health care provider about any questions you may have about miscarrying, the grieving process, and your plans for future pregnancy.  What are the causes?  A miscarriage may be caused by:  · Problems with the genes or chromosomes of the fetus. These problems make it impossible for the baby to develop normally. They are often the result of random errors that occur early in the development of the baby, and are not passed from parent to child (not inherited).  · Infection of the cervix or uterus.  · Conditions that affect hormone balance in the body.  · Problems with the cervix, such as the cervix opening and thinning before pregnancy is at term (cervical insufficiency).  · Problems with the uterus. These may include:  ? A uterus with an abnormal shape.  ? Fibroids in the uterus.  ? Congenital abnormalities. These are problems that were present at birth.  · Certain medical conditions.  · Smoking, drinking alcohol, or using drugs.  · Injury (trauma).  In many cases, the cause of a miscarriage is not known.  What are the signs or symptoms?  Symptoms of this condition include:  · Vaginal bleeding or spotting, with or without cramps or pain.  · Pain or cramping in the abdomen or lower back.  · Passing fluid, tissue, or blood clots from the vagina.  How is this diagnosed?  This condition may be diagnosed based on:  · A physical exam.  · Ultrasound.  · Blood tests.  · Urine tests.  How is this treated?  Treatment for a miscarriage is sometimes not necessary if you naturally pass all the tissue that was in your uterus. If necessary, this condition may be treated with:  · Dilation and  curettage (D&C). This is a procedure in which the cervix is stretched open and the lining of the uterus (endometrium) is scraped. This is done only if tissue from the fetus or placenta remains in the body (incomplete miscarriage).  · Medicines, such as:  ? Antibiotic medicine, to treat infection.  ? Medicine to help the body pass any remaining tissue.  ? Medicine to reduce (contract) the size of the uterus. These medicines may be given if you have a lot of bleeding.  If you have Rh negative blood and your baby was Rh positive, you will need a shot of a medicine called Rh immunoglobulinto protect your future babies from Rh blood problems. "Rh-negative" and "Rh-positive" refer to whether or not the blood has a specific protein found on the surface of red blood cells (Rh factor).  Follow these instructions at home:  Medicines    · Take over-the-counter and prescription medicines only as told by your health care provider.  · If you were prescribed antibiotic medicine, take it as told by your health care provider. Do not stop taking the antibiotic even if you start to feel better.  · Do not take NSAIDs, such as aspirin and ibuprofen, unless they are approved by your health care provider. These medicines can cause bleeding.  Activity  · Rest as directed. Ask your health care provider what activities are safe for you.  · Have someone   help with home and family responsibilities during this time.  General instructions  · Keep track of the number of sanitary pads you use each day and how soaked (saturated) they are. Write down this information.  · Monitor the amount of tissue or blood clots that you pass from your vagina. Save any large amounts of tissue for your health care provider to examine.  · Do not use tampons, douche, or have sex until your health care provider approves.  · To help you and your partner with the process of grieving, talk with your health care provider or seek counseling.  · When you are ready, meet with  your health care provider to discuss any important steps you should take for your health. Also, discuss steps you should take to have a healthy pregnancy in the future.  · Keep all follow-up visits as told by your health care provider. This is important.  Where to find more information  · The American Congress of Obstetricians and Gynecologists: www.acog.org  · U.S. Department of Health and Human Services Office of Women’s Health: www.womenshealth.gov  Contact a health care provider if:  · You have a fever or chills.  · You have a foul smelling vaginal discharge.  · You have more bleeding instead of less.  Get help right away if:  · You have severe cramps or pain in your back or abdomen.  · You pass blood clots or tissue from your vagina that is walnut-sized or larger.  · You soak more than 1 regular sanitary pad in an hour.  · You become light-headed or weak.  · You pass out.  · You have feelings of sadness that take over your thoughts, or you have thoughts of hurting yourself.  Summary  · Most miscarriages happen in the first 3 months of pregnancy. Sometimes miscarriage happens before a woman even knows that she is pregnant.  · Follow your health care provider's instruction for home care. Keep all follow-up appointments.  · To help you and your partner with the process of grieving, talk with your health care provider or seek counseling.  This information is not intended to replace advice given to you by your health care provider. Make sure you discuss any questions you have with your health care provider.  Document Released: 11/27/2000 Document Revised: 07/09/2016 Document Reviewed: 07/09/2016  Elsevier Interactive Patient Education © 2019 [employer]vier Inc[employer]

## 2018-12-05 NOTE — MAU Provider Note (Signed)
Subjective:  Melody Patrick is a 23 y.o. G1P1001 at Unknown who presents today for vaginal bleeding in pregnancy. Pt reports she had a positive pregnancy test at home, which was confirmed by the health department. Pt was seen this morning at [facility] and had a negative urine pregnancy test. HCG here today was 6. Pt reports recent history of heavy vaginal bleeding and heavy cramping. Pt reports mild vaginal bleeding today and denies any pelvic or abdominal pain.   Objective:  Physical Exam  Nursing note and vitals reviewed. Constitutional: She is oriented to person, place, and time. She appears well-developed and well-nourished. No distress.  HENT:  Head: Normocephalic.  Cardiovascular: Normal rate.  Respiratory: Effort normal.  GI: Soft. There is no tenderness.  Neurological: She is alert and oriented to person, place, and time. Skin: Skin is warm and dry.  Psychiatric: She has a normal mood and affect.   Results for orders placed or performed during the hospital encounter of 12/05/18 (from the past 24 hour(s))  hCG, quantitative, pregnancy     Status: Abnormal   Collection Time: 12/05/18 12:08 PM  Result Value Ref Range   hCG, Beta Chain, Quant, S 6 (H) <5 mIU/mL   Per Dr. Ilda Basset, [place] to be discharged from MAU without ectopic work-up with f/u hCG in clinic in 7-10days.  Assessment/Plan: SAB HCG 6 FU in 7-10 days for: repeat hCG Pt given contact information for [facility] and instructed to call on Thursday if no one has contacted her before that time to schedule an appt for hCG f/u Strict ectopic/return MAU precautions given Pt given list of options for emergency care for GYN concerns Pt discharged to home in stable condition

## 2018-12-05 NOTE — Discharge Instructions (Signed)
Please follow-up at the ER for blood pregnancy test and possible imaging.

## 2018-12-14 ENCOUNTER — Other Ambulatory Visit: Payer: Medicaid Other

## 2018-12-16 ENCOUNTER — Other Ambulatory Visit: Payer: Medicaid Other

## 2018-12-16 ENCOUNTER — Other Ambulatory Visit: Payer: Self-pay

## 2018-12-16 DIAGNOSIS — O039 Complete or unspecified spontaneous abortion without complication: Secondary | ICD-10-CM | POA: Diagnosis not present

## 2018-12-17 LAB — BETA HCG QUANT (REF LAB): hCG Quant: <1 m[IU]/mL

## 2018-12-17 NOTE — Progress Notes (Signed)
HCG <1 7/1 c/w miscarriage

## 2019-05-10 ENCOUNTER — Encounter (HOSPITAL_COMMUNITY): Payer: Self-pay

## 2019-05-10 ENCOUNTER — Other Ambulatory Visit: Payer: Self-pay

## 2019-05-10 ENCOUNTER — Ambulatory Visit (HOSPITAL_COMMUNITY)
Admission: EM | Admit: 2019-05-10 | Discharge: 2019-05-10 | Disposition: A | Discharge status: Home / Self Care | Payer: Medicaid Other | Attending: Family Medicine | Admitting: Family Medicine

## 2019-05-10 DIAGNOSIS — Z202 Contact with and (suspected) exposure to infections with a predominantly sexual mode of transmission: Secondary | ICD-10-CM | POA: Insufficient documentation

## 2019-05-10 LAB — HIV ANTIBODY (ROUTINE TESTING W REFLEX): HIV Screen 4th Generation wRfx: NONREACTIVE

## 2019-05-10 MED ORDER — CEFTRIAXONE SODIUM 250 MG IJ SOLR
250.0000 mg | Freq: Once | INTRAMUSCULAR | Status: AC
  Administered 2019-05-10: 12:00:00 250 mg via INTRAMUSCULAR

## 2019-05-10 MED ORDER — AZITHROMYCIN 250 MG PO TABS
ORAL_TABLET | ORAL | Status: AC
  Filled 2019-05-10: qty 4

## 2019-05-10 MED ORDER — AZITHROMYCIN 250 MG PO TABS
1000.0000 mg | ORAL_TABLET | Freq: Once | ORAL | Status: AC
  Administered 2019-05-10: 1000 mg via ORAL

## 2019-05-10 MED ORDER — CEFTRIAXONE SODIUM 250 MG IJ SOLR
INTRAMUSCULAR | Status: AC
  Filled 2019-05-10: qty 250

## 2019-05-10 NOTE — ED Triage Notes (Signed)
Pt states she has been told by her partner that he has Chlamydia . Pt states she wants to be tested for STD's.

## 2019-05-10 NOTE — Discharge Instructions (Addendum)

## 2019-05-10 NOTE — ED Provider Notes (Signed)
[facility]   [id number] 05/10/19 Arrival Time: 1113  ASSESSMENT & PLAN:  1. Possible exposure to STD     No s/s of PID.   Discharge Instructions     You have been given the following medications today for treatment of suspected gonorrhea and/or chlamydia:  cefTRIAXone (ROCEPHIN) injection 250 mg azithromycin (ZITHROMAX) tablet 1,000 mg  Even though we have treated you today, we have sent testing for sexually transmitted infections. We will notify you of any positive results once they are received. If required, we will prescribe any medications you might need.  Please refrain from all sexual activity for at least the next seven days.     Pending: Labs Reviewed  HIV ANTIBODY (ROUTINE TESTING W REFLEX)  RPR  CERVICOVAGINAL ANCILLARY ONLY    Will notify of any positive results. Instructed to refrain from sexual activity for at least seven days.  Reviewed expectations re: course of current medical issues. Questions answered. Outlined signs and symptoms indicating need for more acute intervention. Patient verbalized understanding. After Visit Summary given.   SUBJECTIVE:  Melody Patrick is a 23 y.o. female who reports possible exposure to STD. Sexual partner tested + for chlamydia. She reports no symptoms including vaginal discharge or urinary symptoms. Afebrile. No abdominal or pelvic pain. Normal PO intake wihout n/v. No rashes or lesions. Reports that she is sexually active with single female partner. History of STI: none reported.  Patient's last menstrual period was 04/13/2019.  ROS: As per HPI. All other systems negative.   OBJECTIVE:  Vitals:   05/10/19 1156 05/10/19 1157  BP:  119/74  Pulse:  79  Resp:  15  Temp:  98.7 F (37.1 C)  SpO2:  100%  Weight: 72.6 kg      General appearance: alert, cooperative, appears stated age and no distress Throat: lips, mucosa, and tongue normal; teeth and gums normal CV: RRR Lungs: CTAB Back: no CVA  tenderness; FROM at waist Abdomen: soft, non-tender GU: deferred Skin: warm and dry Psychological: alert and cooperative; normal mood and affect.   Labs Reviewed  HIV ANTIBODY (ROUTINE TESTING W REFLEX)  RPR  CERVICOVAGINAL ANCILLARY ONLY    No Known Allergies   Family History  Problem Relation Age of Onset  . Hypertension Maternal Grandmother   . Healthy Mother    Social History   Socioeconomic History  . Marital status: Single    Spouse name: Not on file  . Number of children: Not on file  . Years of education: Not on file  . Highest education level: Not on file  Occupational History  . Not on file  Social Needs  . Financial resource strain: Not on file  . Food insecurity    Worry: Not on file    Inability: Not on file  . Transportation needs    Medical: Not on file    Non-medical: Not on file  Tobacco Use  . Smoking status: Former Smoker    Types: Cigarettes  . Smokeless tobacco: Never Used  Substance and Sexual Activity  . Alcohol use: Yes    Comment: occasionally  . Drug use: Yes    Types: Marijuana  . Sexual activity: Yes    Birth control/protection: None    Comment: occasional condom use  Lifestyle  . Physical activity    Days per week: Not on file    Minutes per session: Not on file  . Stress: Not on file  Relationships  . Social connections  Talks on phone: Not on file    Gets together: Not on file    Attends religious service: Not on file    Active member of club or organization: Not on file    Attends meetings of clubs or organizations: Not on file    Relationship status: Not on file  . Intimate partner violence    Fear of current or ex partner: Not on file    Emotionally abused: Not on file    Physically abused: Not on file    Forced sexual activity: Not on file  Other Topics Concern  . Not on file  Social History Narrative  . Not on file          Vanessa Kick, MD 05/10/19 1213

## 2019-05-11 LAB — RPR: RPR Ser Ql: NONREACTIVE

## 2019-05-12 ENCOUNTER — Telehealth (HOSPITAL_COMMUNITY): Payer: Self-pay | Admitting: Emergency Medicine

## 2019-05-12 LAB — CERVICOVAGINAL ANCILLARY ONLY
Bacterial vaginitis: POSITIVE — AB
Candida vaginitis: POSITIVE — AB
Chlamydia: NEGATIVE
Neisseria Gonorrhea: NEGATIVE
Trichomonas: NEGATIVE

## 2019-05-12 MED ORDER — FLUCONAZOLE 150 MG PO TABS: 150.0000 mg | ORAL_TABLET | Freq: Once | ORAL | 0 refills | Status: AC

## 2019-05-12 MED ORDER — METRONIDAZOLE 500 MG PO TABS: 500.0000 mg | ORAL_TABLET | Freq: Two times a day (BID) | ORAL | 0 refills | Status: AC

## 2019-05-12 NOTE — Telephone Encounter (Signed)
Bacterial vaginosis is positive. This was not treated at the urgent care visit.  Flagyl 500 mg BID x 7 days #14 no refills sent to patients pharmacy of choice.    Test for candida (yeast) was positive.  Prescription for fluconazole 150mg  po now, repeat dose in 3d if needed, #2 no refills, sent to the pharmacy of record.  Recheck or followup with PCP for further evaluation if symptoms are not improving.    Patient contacted and made aware of    results. Pt verbalized understanding and had all questions answered.

## 2019-09-30 ENCOUNTER — Ambulatory Visit (INDEPENDENT_AMBULATORY_CARE_PROVIDER_SITE_OTHER)
Admission: RE | Admit: 2019-09-30 | Discharge: 2019-09-30 | Disposition: A | Discharge status: Home / Self Care | Payer: Medicaid Other | Source: Ambulatory Visit

## 2019-09-30 ENCOUNTER — Other Ambulatory Visit: Payer: Self-pay

## 2019-09-30 DIAGNOSIS — K029 Dental caries, unspecified: Secondary | ICD-10-CM | POA: Diagnosis not present

## 2019-09-30 DIAGNOSIS — K0889 Other specified disorders of teeth and supporting structures: Secondary | ICD-10-CM

## 2019-09-30 DIAGNOSIS — K047 Periapical abscess without sinus: Secondary | ICD-10-CM | POA: Diagnosis not present

## 2019-09-30 MED ORDER — IBUPROFEN 800 MG PO TABS: 800.0000 mg | ORAL_TABLET | Freq: Three times a day (TID) | ORAL | 0 refills | Status: DC

## 2019-09-30 MED ORDER — AMOXICILLIN-POT CLAVULANATE 875-125 MG PO TABS: 1.0000 | ORAL_TABLET | Freq: Two times a day (BID) | ORAL | 0 refills | Status: DC

## 2019-09-30 MED ORDER — CHLORHEXIDINE GLUCONATE 0.12 % MT SOLN: 15.0000 mL | Freq: Two times a day (BID) | OROMUCOSAL | 0 refills | Status: DC

## 2019-09-30 NOTE — Discharge Instructions (Addendum)
Follow-up with your dentist Follow-up with PCP  Return or go to ED for worsening of symptoms

## 2019-09-30 NOTE — ED Provider Notes (Signed)
Virtual Visit via Video Note:  Melody Patrick  initiated request for Telemedicine visit with [facility] Urgent Care team. I connected with Melody Patrick  on 09/30/2019 at 11:57 AM  for a synchronized telemedicine visit using a video enabled HIPPA compliant telemedicine application. I verified that I am speaking with Melody Patrick  using two identifiers. Melody Parcel, Melody Patrick  was physically located in a [facility] Urgent care site and Melody Patrick was located at a different location.   The limitations of evaluation and management by telemedicine as well as the availability of in-person appointments were discussed. Patient was informed that she  may incur a bill ( including co-pay) for this virtual visit encounter. Melody Patrick  expressed understanding and gave verbal consent to proceed with virtual visit.     History of Present Illness:Melody Patrick  is a 24 y.o. female presents via telehealth with a complaint of dental pain, and abscess for the past few days. States she had a dental caries.  Report her right cheek is swollen.  Denies a precipitating event or trauma.  Localizes pain to his right lower gum.  Has tried OTC analgesics without relief.  Worse with chewing.  Does not see a dentist regularly.   Denies similar symptoms in the past.  Denies fever, chills, dysphagia, odynophagia, oral or neck swelling, nausea, vomiting, chest pain, SOB.    History reviewed. No pertinent past medical history.  No Known Allergies      Observations/Objective: GENERAL: Alert, appears well and in no acute distress. NECK: Normal movements of the head and neck. CARDIOPULMONARY: No increased WOB. Speaking in clear sentences. I:E ratio WNL.  MS: Moves all visible extremities without noticeable abnormality. PSYCH: Pleasant and cooperative, well-groomed. Speech normal rate and rhythm. Affect is appropriate. Insight and judgement are appropriate. Attention is focused, linear, and  appropriate.  NEURO: Oriented as arrived to appointment on time with no prompting. Moves both UE equally.      Assessment and Plan:   ICD-10-CM   1. Dental abscess  K04.7 amoxicillin-clavulanate (AUGMENTIN) 875-125 MG tablet    chlorhexidine (PERIDEX) 0.12 % solution  2. Dental caries  K02.9 amoxicillin-clavulanate (AUGMENTIN) 875-125 MG tablet    chlorhexidine (PERIDEX) 0.12 % solution  3. Pain, dental  K08.89 amoxicillin-clavulanate (AUGMENTIN) 875-125 MG tablet    chlorhexidine (PERIDEX) 0.12 % solution    ibuprofen (ADVIL) 800 MG tablet    Follow Up Instructions: Follow-up with your dentist Follow-up with PCP  Return or go to ED for worsening of symptoms   I discussed the assessment and treatment plan with the patient. The patient was provided an opportunity to ask questions and all were answered. The patient agreed with the plan and demonstrated an understanding of the instructions.   The patient was advised to call back or seek an in-person evaluation if the symptoms worsen or if the condition fails to improve as anticipated.  I provided 12 minutes of non-face-to-face time during this encounter.           Melody Parcel, Melody Patrick 09/30/19 [phone]

## 2019-10-03 ENCOUNTER — Telehealth (HOSPITAL_COMMUNITY): Payer: Self-pay

## 2019-10-03 MED ORDER — FLUCONAZOLE 200 MG PO TABS: 200.0000 mg | ORAL_TABLET | Freq: Once | ORAL | 0 refills | Status: AC

## 2019-10-06 ENCOUNTER — Telehealth (HOSPITAL_COMMUNITY): Payer: Self-pay

## 2019-10-06 NOTE — Telephone Encounter (Signed)
Pt reports is having diarrhea x 2 days, after taking Amoxicillin-clavulanate. Pt was told we are going to speak to the provider call her back.

## 2019-10-06 NOTE — Telephone Encounter (Signed)
Called back to the pt after speaks to the Doctor, and told to tale imodium, as the diarrhea is a side effect of the amoxicillin. Pt agree and understood.

## 2019-11-01 ENCOUNTER — Inpatient Hospital Stay
Admission: RE | Admit: 2019-11-01 | Discharge: 2019-11-01 | Disposition: A | Discharge status: Home / Self Care | Payer: Medicaid Other | Source: Ambulatory Visit

## 2019-11-04 ENCOUNTER — Ambulatory Visit (INDEPENDENT_AMBULATORY_CARE_PROVIDER_SITE_OTHER)
Admission: RE | Admit: 2019-11-04 | Discharge: 2019-11-04 | Disposition: A | Discharge status: Home / Self Care | Payer: Medicaid Other | Source: Ambulatory Visit

## 2019-11-04 ENCOUNTER — Other Ambulatory Visit: Payer: Self-pay

## 2019-11-04 DIAGNOSIS — R21 Rash and other nonspecific skin eruption: Secondary | ICD-10-CM | POA: Diagnosis not present

## 2019-11-04 MED ORDER — CLOTRIMAZOLE 1 % EX CREA: TOPICAL_CREAM | CUTANEOUS | 0 refills | Status: DC

## 2019-11-04 NOTE — Discharge Instructions (Addendum)
You have a fungal infection on your skin  I have sent in lotrimin for you to use twice a day until the rash resolves.  Follow up as needed.

## 2019-11-04 NOTE — ED Provider Notes (Signed)
[place]  Virtual Visit via Video Note:  Melody Patrick  initiated request for Telemedicine visit with [facility] team. I connected with Melody Patrick  on 11/04/2019 at 1:24 PM  for a synchronized telemedicine visit using a video enabled HIPPA compliant telemedicine application. I verified that I am speaking with Melody Patrick  using two identifiers. Bettey Mare, NP  was physically located in a [facility] Urgent care site and Melody Patrick was located at a different location.   The limitations of evaluation and management by telemedicine as well as the availability of in-person appointments were discussed. Patient was informed that she  may incur a bill ( including co-pay) for this virtual visit encounter. Melody Patrick  expressed understanding and gave verbal consent to proceed with virtual visit.   [id number] 11/04/19 Arrival Time: 1310  CC: RASH  SUBJECTIVE: History from: patient.  Melody Patrick is a 24 y.o. female who presents with abrupt onset of skin rash for about a week. Denies new exposures of detergent, soaps, lotions, etc. Has made no attempts to treat at home.  Symptoms are made worse with sweating. Reports that the area is itchy, but not painful. Reports that the area feels very dry.  Denies fever, chills, fatigue, ear pain, sinus pain, rhinorrhea, nasal congestion, cough, SOB, wheezing, chest pain, nausea, changes in bowel or bladder habits.    ROS: As per HPI.  All other pertinent ROS negative.     No past medical history on file. Past Surgical History:  Procedure Laterality Date  . WISDOM TOOTH EXTRACTION     No Known Allergies No current facility-administered medications on file prior to encounter.   Current Outpatient Medications on File Prior to Encounter  Medication Sig Dispense Refill  . amoxicillin-clavulanate (AUGMENTIN) 875-125 MG tablet Take 1 tablet by mouth every 12 (twelve) hours. 14 tablet 0  .  aspirin-acetaminophen-caffeine (EXCEDRIN MIGRAINE) 124-580-99 MG tablet Take by mouth every 6 (six) hours as needed for headache.    . Aspirin-Caffeine 845-65 MG PACK Take by mouth.    . chlorhexidine (PERIDEX) 0.12 % solution Use as directed 15 mLs in the mouth or throat 2 (two) times daily. 120 mL 0  . cyclobenzaprine (FLEXERIL) 5 MG tablet Take 1-2 tablets (5-10 mg total) by mouth 2 (two) times daily as needed for muscle spasms. 24 tablet 0  . dicyclomine (BENTYL) 20 MG tablet Take 1 tablet (20 mg total) by mouth 2 (two) times daily. 20 tablet 0  . ibuprofen (ADVIL) 800 MG tablet Take 1 tablet (800 mg total) by mouth 3 (three) times daily. 21 tablet 0   Social History   Socioeconomic History  . Marital status: Single    Spouse name: Not on file  . Number of children: Not on file  . Years of education: Not on file  . Highest education level: Not on file  Occupational History  . Not on file  Tobacco Use  . Smoking status: Former Smoker    Types: Cigarettes  . Smokeless tobacco: Never Used  Substance and Sexual Activity  . Alcohol use: Yes    Comment: occasionally  . Drug use: Yes    Types: Marijuana  . Sexual activity: Yes    Birth control/protection: None    Comment: occasional condom use  Other Topics Concern  . Not on file  Social History Narrative  . Not on file   Social Determinants of Health   Financial Resource Strain:   .  Difficulty of Paying Living Expenses:   Food Insecurity:   . Worried About Programme researcher, broadcasting/film/video in the Last Year:   . Barista in the Last Year:   Transportation Needs:   . Freight forwarder (Medical):   Marland Kitchen Lack of Transportation (Non-Medical):   Physical Activity:   . Days of Exercise per Week:   . Minutes of Exercise per Session:   Stress:   . Feeling of Stress :   Social Connections:   . Frequency of Communication with Friends and Family:   . Frequency of Social Gatherings with Friends and Family:   . Attends Religious  Services:   . Active Member of Clubs or Organizations:   . Attends Banker Meetings:   Marland Kitchen Marital Status:   Intimate Partner Violence:   . Fear of Current or Ex-Partner:   . Emotionally Abused:   Marland Kitchen Physically Abused:   . Sexually Abused:    Family History  Problem Relation Age of Onset  . Hypertension Maternal Grandmother   . Healthy Mother     OBJECTIVE:   There were no vitals filed for this visit.  General appearance: alert; no distress Eyes: EOMI grossly HENT: normocephalic; atraumatic Neck: supple with FROM Lungs: normal respiratory effort; speaking in full sentences without difficulty Extremities: moves extremities without difficulty Skin: Rash under L breast that is 1 x 2 cm in area, raised borders, darkened with evidence of excoriation. Neurologic: No facial asymmetries Psychological: alert and cooperative; normal mood and affect  ASSESSMENT & PLAN:  1. Rash and nonspecific skin eruption     Meds ordered this encounter  Medications  . clotrimazole (LOTRIMIN) 1 % cream    Sig: Apply to affected area 2 times daily    Dispense:  15 g    Refill:  0    Order Specific Question:   Supervising Provider    Answer:   Merrilee Jansky [[id number]]     Fungal rash Prescribed lotrimin BID until rash resolves Follow up with PCP if symptoms persist Return or go to ER if you have any new or worsening symptoms such as fever, chills, nausea, vomiting, worsening sore throat, cough, abdominal pain, chest pain, changes in bowel or bladder habits.  I discussed the assessment and treatment plan with the patient. The patient was provided an opportunity to ask questions and all were answered. The patient agreed with the plan and demonstrated an understanding of the instructions.   The patient was advised to call back or seek an in-person evaluation if the symptoms worsen or if the condition fails to improve as anticipated.  I provided 10 minutes of non-face-to-face time  during this encounter.  Marykay Lex, NP  11/04/2019 1:24 PM         Moshe Cipro, NP 11/04/19 1324

## 2019-11-05 ENCOUNTER — Ambulatory Visit (INDEPENDENT_AMBULATORY_CARE_PROVIDER_SITE_OTHER)
Admission: RE | Admit: 2019-11-05 | Discharge: 2019-11-05 | Disposition: A | Discharge status: Home / Self Care | Payer: Medicaid Other | Source: Ambulatory Visit

## 2019-11-05 DIAGNOSIS — M25511 Pain in right shoulder: Secondary | ICD-10-CM | POA: Diagnosis not present

## 2019-11-05 MED ORDER — IBUPROFEN 600 MG PO TABS: 600.0000 mg | ORAL_TABLET | Freq: Four times a day (QID) | ORAL | 0 refills | Status: DC | PRN

## 2019-11-05 NOTE — Discharge Instructions (Signed)
Take the ibuprofen as prescribed.  Rest your shoulder.  Apply ice packs 2-3 times a day for up to 20 minutes each.    Follow up with your primary care provider or come here to be seen in person if your symptoms are not improving.

## 2019-11-05 NOTE — ED Provider Notes (Signed)
Virtual Visit via Video Note:  Melody Patrick  initiated request for Telemedicine visit with [facility] Urgent Care team. I connected with Melody Patrick  on 11/05/2019 at 3:06 PM  for a synchronized telemedicine visit using a video enabled HIPPA compliant telemedicine application. I verified that I am speaking with Melody Patrick  using two identifiers. Melody Bail, Melody Patrick  was physically located in a [facility] Urgent care site and Melody Patrick was located at a different location.   The limitations of evaluation and management by telemedicine as well as the availability of in-person appointments were discussed. Patient was informed that she  may incur a bill ( including co-pay) for this virtual visit encounter. Melody Patrick  expressed understanding and gave verbal consent to proceed with virtual visit.     History of Present Illness:Melody Patrick  is a 24 y.o. female presents for evaluation of right shoulder pain x 1 day.  The pain is worse movement and improves with rest.  No falls or injury.  She denies numbness, weakness, paresthesia, redness, bruising, lesions, rash, or other symptoms.  No treatments attempted at home.  She denies current pregnancy or breastfeeding.  Patient had a video visit yesterday for a fungal rash; she was treated with Lotrimin.      No Known Allergies   History reviewed. No pertinent past medical history.   Social History   Tobacco Use  . Smoking status: Former Smoker    Types: Cigarettes  . Smokeless tobacco: Never Used  Substance Use Topics  . Alcohol use: Yes    Comment: occasionally  . Drug use: Yes    Types: Marijuana   ROS: as stated in HPI.  All other systems reviewed and negative.      Observations/Objective: Physical Exam  VITALS: Patient denies fever. GENERAL: Alert, appears well and in no acute distress. HEENT: Atraumatic. NECK: Normal movements of the head and neck. CARDIOPULMONARY: No increased WOB. Speaking  in clear sentences. I:E ratio WNL.  MS: Moves all visible extremities, including right shoulder, without noticeable abnormality. PSYCH: Pleasant and cooperative, well-groomed. Speech normal rate and rhythm. Affect is appropriate. Insight and judgement are appropriate. Attention is focused, linear, and appropriate.  NEURO: CN grossly intact. Oriented as arrived to appointment on time with no prompting. Moves both UE equally.  SKIN: No obvious lesions, wounds, erythema, or cyanosis noted on face or hands.   Assessment and Plan:    ICD-10-CM   1. Acute pain of right shoulder  M25.511        Follow Up Instructions: Treating with ibuprofen.  Instructed patient to follow-up with her PCP or come here to the urgent care for symptoms or not improving.  Patient agrees to plan of care.    I discussed the assessment and treatment plan with the patient. The patient was provided an opportunity to ask questions and all were answered. The patient agreed with the plan and demonstrated an understanding of the instructions.   The patient was advised to call back or seek an in-person evaluation if the symptoms worsen or if the condition fails to improve as anticipated.      Melody Bail, Melody Patrick  11/05/2019 3:06 PM         Melody Bail, Melody Patrick 11/05/19 1506

## 2019-11-12 ENCOUNTER — Other Ambulatory Visit: Payer: Self-pay

## 2019-11-12 ENCOUNTER — Ambulatory Visit (HOSPITAL_COMMUNITY)
Admission: EM | Admit: 2019-11-12 | Discharge: 2019-11-12 | Disposition: A | Discharge status: Home / Self Care | Payer: Medicaid Other | Attending: Family Medicine | Admitting: Family Medicine

## 2019-11-12 ENCOUNTER — Encounter (HOSPITAL_COMMUNITY): Payer: Self-pay

## 2019-11-12 DIAGNOSIS — Z3202 Encounter for pregnancy test, result negative: Secondary | ICD-10-CM | POA: Diagnosis not present

## 2019-11-12 DIAGNOSIS — R21 Rash and other nonspecific skin eruption: Secondary | ICD-10-CM

## 2019-11-12 DIAGNOSIS — L42 Pityriasis rosea: Secondary | ICD-10-CM | POA: Diagnosis not present

## 2019-11-12 LAB — POC URINE PREG, ED: Preg Test, Ur: NEGATIVE

## 2019-11-12 MED ORDER — CETIRIZINE HCL 10 MG PO TABS
10.0000 mg | ORAL_TABLET | Freq: Two times a day (BID) | ORAL | 0 refills | Status: DC
Start: 2019-11-12 — End: 2020-01-25

## 2019-11-12 MED ORDER — HYDROCORTISONE 2.5 % EX LOTN
TOPICAL_LOTION | Freq: Two times a day (BID) | CUTANEOUS | 1 refills | Status: DC
Start: 2019-11-12 — End: 2020-01-25

## 2019-11-12 NOTE — ED Provider Notes (Signed)
[facility]    CSN: [id number] Arrival date & time: 11/12/19  1129      History   Chief Complaint Chief Complaint  Patient presents with  . Rash    HPI Melody Patrick is a 24 y.o. female.   HPI  Had a video visit a week ago.  She has a circular spot underneath her left breast.  She was treated with Lotrimin for presumed tinea corporis.  Since then she is broken out rash all over her body.  It itches moderately.  Worse when it is hot.  No fever chills or malaise, no other illness or concerns.  No one else at home has a rash. She states she has irregular menstrual periods.  She is concerned for pregnancy.  No abdominal pain, no nausea vomiting, no breast tenderness.  She does have unprotected sexual relations.  No vaginal discharge or concern for STD.  History reviewed. No pertinent past medical history.  Patient Active Problem List   Diagnosis Date Noted  . On Depo-Provera for contraception 10/09/2016    Past Surgical History:  Procedure Laterality Date  . WISDOM TOOTH EXTRACTION      OB History    Gravida  1   Para  1   Term  1   Preterm      AB      Living  1     SAB      TAB      Ectopic      Multiple  0   Live Births  1            Home Medications    Prior to Admission medications   Medication Sig Start Date End Date Taking? Authorizing Provider  cetirizine (ZYRTEC) 10 MG tablet Take 1 tablet (10 mg total) by mouth 2 (two) times daily. 11/12/19   Eustace Moore, MD  hydrocortisone 2.5 % lotion Apply topically 2 (two) times daily. 11/12/19   Eustace Moore, MD  dicyclomine (BENTYL) 20 MG tablet Take 1 tablet (20 mg total) by mouth 2 (two) times daily. 03/17/18 11/12/19  Wieters, Junius Creamer, PA-C    Family History Family History  Problem Relation Age of Onset  . Hypertension Maternal Grandmother   . Healthy Mother     Social History Social History   Tobacco Use  . Smoking status: Former Smoker    Types: Cigarettes   . Smokeless tobacco: Never Used  Substance Use Topics  . Alcohol use: Yes    Comment: occasionally  . Drug use: Yes    Types: Marijuana     Allergies   Patient has no known allergies.   Review of Systems Review of Systems  Genitourinary: Positive for menstrual problem.  Skin: Positive for rash.     Physical Exam Triage Vital Signs ED Triage Vitals [11/12/19 1211]  Enc Vitals Group     BP 120/83     Pulse Rate 62     Resp 16     Temp 98.7 F (37.1 C)     Temp Source Oral     SpO2 99 %     Weight 155 lb (70.3 kg)     Height 5\' 2"  (1.575 m)     Head Circumference      Peak Flow      Pain Score 0     Pain Loc      Pain Edu?      Excl. in GC?    No data  found.  Updated Vital Signs BP 120/83   Pulse 62   Temp 98.7 F (37.1 C) (Oral)   Resp 16   Ht 5\' 2"  (1.575 m)   Wt 70.3 kg   SpO2 99%   BMI 28.35 kg/m       Physical Exam Constitutional:      General: She is not in acute distress.    Appearance: She is well-developed and normal weight.  HENT:     Head: Normocephalic and atraumatic.     Mouth/Throat:     Comments: Mask is in place Eyes:     Conjunctiva/sclera: Conjunctivae normal.     Pupils: Pupils are equal, round, and reactive to light.  Cardiovascular:     Rate and Rhythm: Normal rate.  Pulmonary:     Effort: Pulmonary effort is normal. No respiratory distress.  Musculoskeletal:        General: Normal range of motion.     Cervical back: Normal range of motion.  Skin:    General: Skin is warm and dry.     Findings: Rash present.     Comments: The oval patch underneath the left breast appears to be a herald patch.  There is a linear pattern to the rash on her back with multiple smaller lesions that are slightly raised, with minimal scale  Neurological:     General: No focal deficit present.     Mental Status: She is alert.  Psychiatric:        Mood and Affect: Mood normal.        Behavior: Behavior normal.      UC Treatments /  Results  Labs (all labs ordered are listed, but only abnormal results are displayed) Labs Reviewed  POC URINE PREG, ED   Pregnancy test is negative   Radiology No results found.  Procedures Procedures (including critical care time)  Medications Ordered in UC Medications - No data to display  Initial Impression / Assessment and Plan / UC Course  I have reviewed the triage vital signs and the nursing notes.  Pertinent labs & imaging results that were available during my care of the patient were reviewed by me and considered in my medical decision making (see chart for details).     I showed the patient pictures of pityriasis rosea on the Internet and discussed the condition with her. You will be given antihistamines for the itching She will be given low-dose cortisone for the rash She is counseled to avoid high heat and hot showers Return as needed Final Clinical Impressions(s) / UC Diagnoses   Final diagnoses:  Pityriasis rosea  Rash and nonspecific skin eruption     Discharge Instructions     Take the cetirizine 2 times a day.  It will help reduce the itching. Use the hydrocortisone lotion as needed for itching You may also use other lotions and creams Avoid heat, hot water.  This makes the itching worse This rash should go away completely over the next few weeks   ED Prescriptions    Medication Sig Dispense Auth. Provider   cetirizine (ZYRTEC) 10 MG tablet Take 1 tablet (10 mg total) by mouth 2 (two) times daily. 30 tablet Raylene Everts, MD   hydrocortisone 2.5 % lotion Apply topically 2 (two) times daily. 59 mL Raylene Everts, MD     PDMP not reviewed this encounter.   Raylene Everts, MD 11/12/19 1300

## 2019-11-12 NOTE — Discharge Instructions (Signed)
Take the cetirizine 2 times a day.  It will help reduce the itching. Use the hydrocortisone lotion as needed for itching You may also use other lotions and creams Avoid heat, hot water.  This makes the itching worse This rash should go away completely over the next few weeks

## 2019-11-12 NOTE — ED Triage Notes (Signed)
Pt c/o rash all over body. Pt was given cream to use through E-visit, but it hasn't helped. Pt states rash is itching. Pt has raised bumps on back, abdomen, under breast, arms bilat, legs bilat, buttocks.

## 2020-01-23 ENCOUNTER — Emergency Department (HOSPITAL_COMMUNITY)
Admission: EM | Admit: 2020-01-23 | Discharge: 2020-01-23 | Disposition: A | Discharge status: Home / Self Care | Payer: Medicaid Other | Attending: Emergency Medicine | Admitting: Emergency Medicine

## 2020-01-23 ENCOUNTER — Encounter (HOSPITAL_COMMUNITY): Payer: Self-pay

## 2020-01-23 ENCOUNTER — Other Ambulatory Visit: Payer: Self-pay

## 2020-01-23 DIAGNOSIS — Z3202 Encounter for pregnancy test, result negative: Secondary | ICD-10-CM | POA: Diagnosis not present

## 2020-01-23 DIAGNOSIS — Z87891 Personal history of nicotine dependence: Secondary | ICD-10-CM | POA: Diagnosis not present

## 2020-01-23 DIAGNOSIS — L292 Pruritus vulvae: Secondary | ICD-10-CM | POA: Diagnosis not present

## 2020-01-23 DIAGNOSIS — N898 Other specified noninflammatory disorders of vagina: Secondary | ICD-10-CM

## 2020-01-23 LAB — POC URINE PREG, ED: Preg Test, Ur: NEGATIVE

## 2020-01-23 NOTE — ED Provider Notes (Signed)
[facility]-EMERGENCY DEPT Provider Note   CSN: [id number] Arrival date & time: 01/23/20  8185     History Chief Complaint  Patient presents with  . STD check    Melody Patrick is a 24 y.o. female.  HPI      Melody Patrick is a 24 y.o. female, patient with no known pertinent past medical history, presenting to the ED with vaginal itching for the last couple days. Patient states her itching is internal in the vagina.  Her female significant other also complained of itching to the genitals. She is requesting pregnancy test and STD testing. Denies fever/chills, abnormal vaginal discharge or bleeding, urinary symptoms, abdominal pain, vomiting, diarrhea, genital lesions, or any other complaints.  History reviewed. No pertinent past medical history.  Patient Active Problem List   Diagnosis Date Noted  . On Depo-Provera for contraception 10/09/2016    Past Surgical History:  Procedure Laterality Date  . WISDOM TOOTH EXTRACTION       OB History    Gravida  1   Para  1   Term  1   Preterm      AB      Living  1     SAB      TAB      Ectopic      Multiple  0   Live Births  1           Family History  Problem Relation Age of Onset  . Hypertension Maternal Grandmother   . Healthy Mother     Social History   Tobacco Use  . Smoking status: Former Smoker    Types: Cigarettes  . Smokeless tobacco: Never Used  Vaping Use  . Vaping Use: Never used  Substance Use Topics  . Alcohol use: Yes    Comment: occasionally  . Drug use: Yes    Types: Marijuana    Home Medications Prior to Admission medications   Medication Sig Start Date End Date Taking? Authorizing Provider  cetirizine (ZYRTEC) 10 MG tablet Take 1 tablet (10 mg total) by mouth 2 (two) times daily. Patient not taking: Reported on 01/23/2020 11/12/19   Eustace Moore, MD  hydrocortisone 2.5 % lotion Apply topically 2 (two) times daily. Patient not taking:  Reported on 01/23/2020 11/12/19   Eustace Moore, MD  dicyclomine (BENTYL) 20 MG tablet Take 1 tablet (20 mg total) by mouth 2 (two) times daily. 03/17/18 11/12/19  Wieters, Hallie C, PA-C    Allergies    Patient has no known allergies.  Review of Systems   Review of Systems  Constitutional: Negative for fever.  Respiratory: Negative for shortness of breath.   Cardiovascular: Negative for chest pain.  Gastrointestinal: Negative for abdominal pain, diarrhea and vomiting.  Genitourinary: Negative for difficulty urinating, dyspareunia, dysuria, flank pain, hematuria, pelvic pain, vaginal bleeding and vaginal discharge.       Vaginal itching  Musculoskeletal: Negative for back pain.  All other systems reviewed and are negative.   Physical Exam Updated Vital Signs BP (!) 128/93   Pulse 81   Temp 97.9 F (36.6 C) (Oral)   Resp 18   Ht 5\' 3"  (1.6 m)   Wt 71.2 kg   LMP 12/16/2019 (Approximate)   SpO2 100%   BMI 27.81 kg/m   Physical Exam Vitals and nursing note reviewed.  Constitutional:      General: She is not in acute distress.    Appearance: She is well-developed. She  is not diaphoretic.  HENT:     Head: Normocephalic and atraumatic.     Mouth/Throat:     Mouth: Mucous membranes are moist.     Pharynx: Oropharynx is clear.  Eyes:     Conjunctiva/sclera: Conjunctivae normal.  Cardiovascular:     Rate and Rhythm: Normal rate and regular rhythm.     Pulses: Normal pulses.          Radial pulses are 2+ on the right side and 2+ on the left side.  Pulmonary:     Effort: Pulmonary effort is normal. No respiratory distress.  Abdominal:     Palpations: Abdomen is soft.     Tenderness: There is no abdominal tenderness. There is no guarding.  Musculoskeletal:     Cervical back: Neck supple.  Skin:    General: Skin is warm and dry.  Neurological:     Mental Status: She is alert.  Psychiatric:        Mood and Affect: Mood and affect normal.        Speech: Speech normal.         Behavior: Behavior normal.     ED Results / Procedures / Treatments   Labs (all labs ordered are listed, but only abnormal results are displayed) Labs Reviewed  WET PREP, GENITAL  POC URINE PREG, ED    EKG None  Radiology No results found.  Procedures Procedures (including critical care time)  Medications Ordered in ED Medications - No data to display  ED Course  I have reviewed the triage vital signs and the nursing notes.  Pertinent labs & imaging results that were available during my care of the patient were reviewed by me and considered in my medical decision making (see chart for details).    MDM Rules/Calculators/A&P                          Patient presents with vaginal itching. Pregnancy test negative. Patient told RN she would have to leave.  Patient eloped prior to pelvic exam or further lab testing.   Final Clinical Impression(s) / ED Diagnoses Final diagnoses:  Vaginal itching    Rx / DC Orders ED Discharge Orders    None       Concepcion Living 01/23/20 1327    Mancel Bale, MD 01/27/20 1958

## 2020-01-23 NOTE — ED Triage Notes (Signed)
Patient is requesting a pregnancy check and aan STD check. Patient states she is having vaginal itching. Patient denies any vaginal drainage, pain, or odor.

## 2020-01-23 NOTE — ED Notes (Signed)
Walked into patients room to set up pelvic exam equipment and patient was no where in sight. Pts gown was left on bed and personal belongings are gone.

## 2020-01-24 ENCOUNTER — Telehealth: Payer: Self-pay | Admitting: *Deleted

## 2020-01-24 NOTE — Telephone Encounter (Signed)
Melody Patrick presented to the ED and eloped on 01/23/20. The patient has been enrolled in an automated general discharge outreach program and 2 attempts to contact the patient will be made to follow up on their ED visit and subsequent needs. The care management team is available to provide assistance to this patient at any time.   Burnard Bunting, RN, BSN, CCRN [facility] [phone]

## 2020-01-25 ENCOUNTER — Other Ambulatory Visit: Payer: Self-pay

## 2020-01-25 ENCOUNTER — Telehealth (HOSPITAL_COMMUNITY): Payer: Self-pay | Admitting: Urgent Care

## 2020-01-25 ENCOUNTER — Encounter (HOSPITAL_COMMUNITY): Payer: Self-pay

## 2020-01-25 ENCOUNTER — Ambulatory Visit (HOSPITAL_COMMUNITY)
Admission: EM | Admit: 2020-01-25 | Discharge: 2020-01-25 | Disposition: A | Discharge status: Home / Self Care | Payer: Medicaid Other | Attending: Urgent Care | Admitting: Urgent Care

## 2020-01-25 DIAGNOSIS — Z7251 High risk heterosexual behavior: Secondary | ICD-10-CM | POA: Diagnosis not present

## 2020-01-25 DIAGNOSIS — Z202 Contact with and (suspected) exposure to infections with a predominantly sexual mode of transmission: Secondary | ICD-10-CM | POA: Diagnosis not present

## 2020-01-25 DIAGNOSIS — Z3202 Encounter for pregnancy test, result negative: Secondary | ICD-10-CM

## 2020-01-25 LAB — POC URINE PREG, ED: Preg Test, Ur: NEGATIVE

## 2020-01-25 MED ORDER — AZITHROMYCIN 250 MG PO TABS
ORAL_TABLET | ORAL | Status: AC
  Filled 2020-01-25: qty 4

## 2020-01-25 MED ORDER — DOXYCYCLINE HYCLATE 100 MG PO CAPS
100.0000 mg | ORAL_CAPSULE | Freq: Two times a day (BID) | ORAL | 0 refills | Status: DC
Start: 2020-01-25 — End: 2021-05-04

## 2020-01-25 MED ORDER — AZITHROMYCIN 250 MG PO TABS
1000.0000 mg | ORAL_TABLET | Freq: Once | ORAL | Status: AC
  Administered 2020-01-25: 1000 mg via ORAL

## 2020-01-25 MED ORDER — ONDANSETRON 8 MG PO TBDP
8.0000 mg | ORAL_TABLET | Freq: Three times a day (TID) | ORAL | 0 refills | Status: DC | PRN
Start: 2020-01-25 — End: 2021-05-04

## 2020-01-25 NOTE — Telephone Encounter (Signed)
Patient called and states that she vomited her medication after she left the clinic.  We will have her do doxycycline for coverage of chlamydia instead.  We will also prescribe Zofran for nausea and vomiting.

## 2020-01-25 NOTE — Discharge Instructions (Addendum)
Avoid all forms of sexual intercourse (oral, vaginal, anal) for the next 7 days to avoid spreading/reinfecting. Return if symptoms worsen/do not resolve, you develop fever, abdominal pain, blood in your urine, or are re-exposed to an STI.  

## 2020-01-25 NOTE — ED Triage Notes (Signed)
Pt presents for STD's testing. Reports her boyfriend tested positive for Chlamydia 2 days ago. Pt denies symptoms.

## 2020-01-25 NOTE — ED Notes (Signed)
Patient to bathroom for sample collection
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
## 2020-01-27 LAB — CERVICOVAGINAL ANCILLARY ONLY
Bacterial Vaginitis (gardnerella): POSITIVE — AB
Candida Glabrata: NEGATIVE
Candida Vaginitis: NEGATIVE
Chlamydia: POSITIVE — AB
Comment: NEGATIVE
Comment: NEGATIVE
Comment: NEGATIVE
Comment: NEGATIVE
Comment: NEGATIVE
Comment: NORMAL
Neisseria Gonorrhea: NEGATIVE
Trichomonas: NEGATIVE

## 2020-02-04 ENCOUNTER — Other Ambulatory Visit: Payer: Self-pay

## 2020-02-08 ENCOUNTER — Other Ambulatory Visit: Payer: Self-pay

## 2020-02-15 DIAGNOSIS — Z20822 Contact with and (suspected) exposure to covid-19: Secondary | ICD-10-CM | POA: Diagnosis not present
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
## 2020-05-10 ENCOUNTER — Telehealth: Payer: Medicaid Other | Admitting: Nurse Practitioner

## 2020-05-10 DIAGNOSIS — B3731 Acute candidiasis of vulva and vagina: Secondary | ICD-10-CM

## 2020-05-10 DIAGNOSIS — B373 Candidiasis of vulva and vagina: Secondary | ICD-10-CM

## 2020-05-10 MED ORDER — FLUCONAZOLE 150 MG PO TABS
150.0000 mg | ORAL_TABLET | Freq: Once | ORAL | 0 refills | Status: AC
Start: 2020-05-10 — End: 2020-05-10

## 2020-05-10 NOTE — Progress Notes (Signed)

## 2020-07-29 ENCOUNTER — Telehealth: Payer: Medicaid Other | Admitting: Nurse Practitioner

## 2020-07-29 DIAGNOSIS — N926 Irregular menstruation, unspecified: Secondary | ICD-10-CM

## 2020-07-29 DIAGNOSIS — N898 Other specified noninflammatory disorders of vagina: Secondary | ICD-10-CM

## 2020-07-29 NOTE — Progress Notes (Signed)
Based on what you shared with me it looks like you have vaginal discharge,that should be evaluated in a face to face office visit. Need to make sure not pregnant before can treat. Lots of meds cannot take if pregnant. Also not uncommon to have vaginal discharge when pregnant.    NOTE: If you entered your credit card information for this eVisit, you will not be charged. You may see a "hold" on your card for the $35 but that hold will drop off and you will not have a charge processed.  If you are having a true medical emergency please call 911.     For an urgent face to face visit, [facility] has four urgent care centers for your convenience:   . [facility]    [phone]                  Get Driving Directions  [address] [place], [place] [address] . 10 am to 8 pm Monday-Friday . 12 pm to 8 pm Saturday-Sunday   . [facility] at [facility]  [phone]                  Get Driving Directions  [address] [place], [place] [address] . 8 am to 8 pm Monday-Friday . 9 am to 6 pm Saturday . 11 am to 6 pm Sunday   . [facility] at [facility]  [phone]                  Get Driving Directions   [address] [place], [place] [address] . 8 am to 8 pm Monday-Friday . 8 am to 4 pm Saturday-Sunday    . [facility] at [facility] Directions  [phone]  [address], [address] [place], [place] [address]  . Monday-Friday, 12 PM to 6 PM    Your e-visit answers were reviewed by a board certified advanced clinical practitioner to complete your personal care plan.  Thank you for using e-Visits.

## 2020-08-06 ENCOUNTER — Telehealth: Payer: Medicaid Other | Admitting: Family

## 2020-08-06 DIAGNOSIS — B373 Candidiasis of vulva and vagina: Secondary | ICD-10-CM | POA: Diagnosis not present

## 2020-08-06 DIAGNOSIS — B3731 Acute candidiasis of vulva and vagina: Secondary | ICD-10-CM

## 2020-08-06 MED ORDER — FLUCONAZOLE 150 MG PO TABS: 150.0000 mg | ORAL_TABLET | ORAL | 0 refills | Status: DC | PRN

## 2020-08-06 NOTE — Progress Notes (Signed)

## 2020-08-10 ENCOUNTER — Other Ambulatory Visit: Payer: Self-pay

## 2020-08-10 ENCOUNTER — Ambulatory Visit (HOSPITAL_COMMUNITY)
Admission: EM | Admit: 2020-08-10 | Discharge: 2020-08-10 | Disposition: A | Discharge status: Home / Self Care | Payer: Medicaid Other | Attending: Family Medicine | Admitting: Family Medicine

## 2020-08-10 ENCOUNTER — Telehealth: Payer: Medicaid Other | Admitting: Emergency Medicine

## 2020-08-10 ENCOUNTER — Encounter (HOSPITAL_COMMUNITY): Payer: Self-pay

## 2020-08-10 DIAGNOSIS — N898 Other specified noninflammatory disorders of vagina: Secondary | ICD-10-CM

## 2020-08-10 MED ORDER — METRONIDAZOLE 500 MG PO TABS
500.0000 mg | ORAL_TABLET | Freq: Two times a day (BID) | ORAL | 0 refills | Status: DC
Start: 2020-08-10 — End: 2021-04-21

## 2020-08-10 NOTE — ED Provider Notes (Signed)
[facility]    CSN: [id number] Arrival date & time: 08/10/20  1307      History   Chief Complaint Chief Complaint  Patient presents with  . Vaginal Itching    HPI TERSA Patrick is a 25 y.o. female.   HPI Patient presents today with vaginal irritation and noticing any follow-up related to her vaginal discharge from that she is painful ceased over 4 days ago.  Patient has already been prescribed medication for a yeast infection however symptoms have not resolved.  She was treated via telemedicine encounter for acute vaginitis and presents today for cytology test to evaluate source of symptoms.  She denies any known exposures to STDs.  She is sexually active with one partner. History reviewed. No pertinent past medical history.  Patient Active Problem List   Diagnosis Date Noted  . On Depo-Provera for contraception 10/09/2016    Past Surgical History:  Procedure Laterality Date  . WISDOM TOOTH EXTRACTION      OB History    Gravida  1   Para  1   Term  1   Preterm      AB      Living  1     SAB      IAB      Ectopic      Multiple  0   Live Births  1            Home Medications    Prior to Admission medications   Medication Sig Start Date End Date Taking? Authorizing Provider  metroNIDAZOLE (FLAGYL) 500 MG tablet Take 1 tablet (500 mg total) by mouth 2 (two) times daily. 08/10/20  Yes Bing Neighbors, FNP  doxycycline (VIBRAMYCIN) 100 MG capsule Take 1 capsule (100 mg total) by mouth 2 (two) times daily. 01/25/20   Wallis Bamberg, PA-C  fluconazole (DIFLUCAN) 150 MG tablet Take 1 tablet (150 mg total) by mouth every three (3) days as needed. 08/06/20   Junie Spencer, FNP  ondansetron (ZOFRAN-ODT) 8 MG disintegrating tablet Take 1 tablet (8 mg total) by mouth every 8 (eight) hours as needed for nausea or vomiting. 01/25/20   Wallis Bamberg, PA-C  cetirizine (ZYRTEC) 10 MG tablet Take 1 tablet (10 mg total) by mouth 2 (two) times  daily. Patient not taking: Reported on 01/23/2020 11/12/19 01/25/20  Eustace Moore, MD  dicyclomine (BENTYL) 20 MG tablet Take 1 tablet (20 mg total) by mouth 2 (two) times daily. 03/17/18 11/12/19  Wieters, Junius Creamer, PA-C    Family History Family History  Problem Relation Age of Onset  . Hypertension Maternal Grandmother   . Healthy Mother     Social History Social History   Tobacco Use  . Smoking status: Former Smoker    Types: Cigarettes  . Smokeless tobacco: Never Used  Vaping Use  . Vaping Use: Never used  Substance Use Topics  . Alcohol use: Yes    Comment: occasionally  . Drug use: Yes    Types: Marijuana     Allergies   Patient has no known allergies.   Review of Systems Review of Systems Pertinent negatives listed in HPI Physical Exam Triage Vital Signs ED Triage Vitals  Enc Vitals Group     BP 08/10/20 1327 117/83     Pulse Rate 08/10/20 1327 74     Resp 08/10/20 1327 19     Temp 08/10/20 1327 98.3 F (36.8 C)     Temp src --  SpO2 08/10/20 1327 97 %     Weight --      Height --      Head Circumference --      Peak Flow --      Pain Score 08/10/20 1326 0     Pain Loc --      Pain Edu? --      Excl. in GC? --    No data found.  Updated Vital Signs BP 117/83   Pulse 74   Temp 98.3 F (36.8 C)   Resp 19   LMP 08/03/2020 (Approximate)   SpO2 97%   Visual Acuity Right Eye Distance:   Left Eye Distance:   Bilateral Distance:    Right Eye Near:   Left Eye Near:    Bilateral Near:     Physical Exam General appearance: alert, well developed, well nourished, cooperative  Head: Normocephalic, without obvious abnormality, atraumatic Respiratory: Respirations even and unlabored, normal respiratory rate Heart: rate and rhythm normal. No gallop or murmurs noted on exam  Abdomen: BS +, no distention, no rebound tenderness, or no CVA tenderness Extremities: No gross deformities Skin: Skin color, texture, turgor normal. No rashes seen   Psych: Appropriate mood and affect. Vaginal cytology self collected UC Treatments / Results  Labs (all labs ordered are listed, but only abnormal results are displayed) Labs Reviewed  CERVICOVAGINAL ANCILLARY ONLY    EKG   Radiology No results found.  Procedures Procedures (including critical care time)  Medications Ordered in UC Medications - No data to display  Initial Impression / Assessment and Plan / UC Course  I have reviewed the triage vital signs and the nursing notes.  Pertinent labs & imaging results that were available during my care of the patient were reviewed by me and considered in my medical decision making (see chart for details).     Vaginal cytology pending.  Given patient has been treated resolution of symptoms with Diflucan will add metronidazole to cover for BV given changes in vaginal discharge symptoms most likely consistent with that of a bacterial vaginosis infection however cannot rule out an STI therefore will delay treatment with any of those medications until cytology results.  Advised to avoid sexual intercourse until STI results are known. Final Clinical Impressions(s) / UC Diagnoses   Final diagnoses:  Vaginal irritation     Discharge Instructions     STD testing is pending will result within 3 days.  In the meantime since you have been treated with Diflucan I am going to start treatment for BV coverage with metronidazole 500 mg twice daily for 7 days.  Continue treatment with your Diflucan take 1 tablet today followed by taking the last tablet 3 days from today.  If any additional treatment is warranted we will contact you via MyChart.   ED Prescriptions    Medication Sig Dispense Auth. Provider   metroNIDAZOLE (FLAGYL) 500 MG tablet Take 1 tablet (500 mg total) by mouth 2 (two) times daily. 14 tablet Bing Neighbors, FNP     PDMP not reviewed this encounter.   Bing Neighbors, FNP 08/14/20 1451

## 2020-08-10 NOTE — ED Triage Notes (Signed)
Pt in with c/o vaginal itching and foul odor discharge that she noticed monday after her menstrual cycle went off  Pt was prescribed medication for yeast infection but her sxs persist

## 2020-08-10 NOTE — ED Provider Notes (Signed)
[facility]    CSN: [id number] Arrival date & time: 08/10/20  1307      History   Chief Complaint Chief Complaint  Patient presents with  . Vaginal Itching    HPI Melody Patrick is a 25 y.o. female.   HPI  Patient presents today for vaginal odor, changes of vaginal discharge with itching which started on 4 days ago following cessation of her menstrual cycle.  Patient had an Evisit and was treated with Diflucan however symptoms progressed to include a vaginal odor.  Patient has sexually active without barrier protection with one partner.  She wishes to be tested for STDs.  No known exposure.  History reviewed. No pertinent past medical history.  Patient Active Problem List   Diagnosis Date Noted  . On Depo-Provera for contraception 10/09/2016    Past Surgical History:  Procedure Laterality Date  . WISDOM TOOTH EXTRACTION      OB History    Gravida  1   Para  1   Term  1   Preterm      AB      Living  1     SAB      IAB      Ectopic      Multiple  0   Live Births  1            Home Medications    Prior to Admission medications   Medication Sig Start Date End Date Taking? Authorizing Provider  doxycycline (VIBRAMYCIN) 100 MG capsule Take 1 capsule (100 mg total) by mouth 2 (two) times daily. 01/25/20   Wallis Bamberg, PA-C  fluconazole (DIFLUCAN) 150 MG tablet Take 1 tablet (150 mg total) by mouth every three (3) days as needed. 08/06/20   Junie Spencer, FNP  ondansetron (ZOFRAN-ODT) 8 MG disintegrating tablet Take 1 tablet (8 mg total) by mouth every 8 (eight) hours as needed for nausea or vomiting. 01/25/20   Wallis Bamberg, PA-C  cetirizine (ZYRTEC) 10 MG tablet Take 1 tablet (10 mg total) by mouth 2 (two) times daily. Patient not taking: Reported on 01/23/2020 11/12/19 01/25/20  Eustace Moore, MD  dicyclomine (BENTYL) 20 MG tablet Take 1 tablet (20 mg total) by mouth 2 (two) times daily. 03/17/18 11/12/19  Wieters, Junius Creamer, PA-C     Family History Family History  Problem Relation Age of Onset  . Hypertension Maternal Grandmother   . Healthy Mother     Social History Social History   Tobacco Use  . Smoking status: Former Smoker    Types: Cigarettes  . Smokeless tobacco: Never Used  Vaping Use  . Vaping Use: Never used  Substance Use Topics  . Alcohol use: Yes    Comment: occasionally  . Drug use: Yes    Types: Marijuana     Allergies   Patient has no known allergies.   Review of Systems Review of Systems Pertinent negatives listed in HPI   Physical Exam Triage Vital Signs ED Triage Vitals  Enc Vitals Group     BP 08/10/20 1327 117/83     Pulse Rate 08/10/20 1327 74     Resp 08/10/20 1327 19     Temp 08/10/20 1327 98.3 F (36.8 C)     Temp src --      SpO2 08/10/20 1327 97 %     Weight --      Height --      Head Circumference --  Peak Flow --      Pain Score 08/10/20 1326 0     Pain Loc --      Pain Edu? --      Excl. in GC? --    No data found.  Updated Vital Signs BP 117/83   Pulse 74   Temp 98.3 F (36.8 C)   Resp 19   LMP 08/03/2020 (Approximate)   SpO2 97%   Visual Acuity Right Eye Distance:   Left Eye Distance:   Bilateral Distance:    Right Eye Near:   Left Eye Near:    Bilateral Near:     Physical Exam  General appearance: alert, well developed, well nourished, cooperative  Head: Normocephalic, without obvious abnormality, atraumatic Respiratory: Respirations even and unlabored, normal respiratory rate Heart: Rate and rhythm normal.  Extremities: No gross deformities Skin: Skin color, texture, turgor normal. No rashes seen  Psych: Appropriate mood and affect. Neurologic: GCS 15, normal gait normal coordination Patient self-collected vaginal cytology UC Treatments / Results  Labs (all labs ordered are listed, but only abnormal results are displayed) Labs Reviewed  CERVICOVAGINAL ANCILLARY ONLY    EKG   Radiology No results  found.  Procedures Procedures (including critical care time)  Medications Ordered in UC Medications - No data to display  Initial Impression / Assessment and Plan / UC Course  I have reviewed the triage vital signs and the nursing notes.  Pertinent labs & imaging results that were available during my care of the patient were reviewed by me and considered in my medical decision making (see chart for details).    Patient presents today with vaginal odor, vaginal irritation and itching.  Tried and failed 1 dose of Diflucan.  We will treat today for BV with a course of metronidazole patient continues to have 2 additional doses of Diflucan advised to take a dose today repeat in 3 days.  Cytology pending if additional treatment is warranted our office will notify her via MyChart.  Did not run a urine pregnancy as patient had a normal, which ended 4 days ago. Final Clinical Impressions(s) / UC Diagnoses   Final diagnoses:  Vaginal irritation     Discharge Instructions     STD testing is pending will result within 3 days.  In the meantime since you have been treated with Diflucan I am going to start treatment for BV coverage with metronidazole 500 mg twice daily for 7 days.  Continue treatment with your Diflucan take 1 tablet today followed by taking the last tablet 3 days from today.  If any additional treatment is warranted we will contact you via MyChart.    ED Prescriptions    Medication Sig Dispense Auth. Provider   metroNIDAZOLE (FLAGYL) 500 MG tablet Take 1 tablet (500 mg total) by mouth 2 (two) times daily. 14 tablet Bing Neighbors, FNP     PDMP not reviewed this encounter.   Bing Neighbors, FNP 08/10/20 1455

## 2020-08-10 NOTE — Discharge Instructions (Addendum)
STD testing is pending will result within 3 days.  In the meantime since you have been treated with Diflucan I am going to start treatment for BV coverage with metronidazole 500 mg twice daily for 7 days.  Continue treatment with your Diflucan take 1 tablet today followed by taking the last tablet 3 days from today.  If any additional treatment is warranted we will contact you via MyChart.

## 2020-08-10 NOTE — Progress Notes (Signed)
Hi Melody Patrick,   Based on what you shared with me, I feel your condition warrants further evaluation and I recommend that you be seen for a face to face office visit.   NOTE: If you entered your credit card information for this eVisit, you will not be charged. You may see a "hold" on your card for the $35 but that hold will drop off and you will not have a charge processed.   If you are having a true medical emergency please call 911.      For an urgent face to face visit, [place] has five urgent care centers for your convenience:     [facility] Health Urgent Care Center at [facility] Directions [phone] [address] [place], [place] [address] . 10 am - 6pm Monday - Friday    [facility] Health Urgent Care Center [facility]) Get Driving Directions [phone] [address] [place], [place] [address] . 10 am to 8 pm Monday-Friday . 12 pm to 8 pm [facility] Urgent Care at [facility] Get Driving Directions [phone] [address], [address] [place], [place] [address] . 8 am to 8 pm Monday-Friday . 9 am to 6 pm Saturday . 11 am to 6 pm Sunday     [facility] Health Urgent Care at [facility] Get Driving Directions  [phone] [address] [place], [place] [address] . 8 am to 8 pm Monday-Friday . 8 am to 4 pm [facility] Urgent Care at [facility] Directions [phone] [address] [place], [place] [address] . 12 pm to 6 pm Monday-Friday      Your e-visit answers were reviewed by a board certified advanced clinical practitioner to complete your personal care plan.  Thank you for using e-Visits.    Greater than 5 minutes, yet less than 10 minutes of time have been spent researching, coordinating, and implementing care for this patient today.

## 2020-08-11 LAB — CERVICOVAGINAL ANCILLARY ONLY
Bacterial Vaginitis (gardnerella): POSITIVE — AB
Candida Glabrata: NEGATIVE
Candida Vaginitis: NEGATIVE
Chlamydia: NEGATIVE
Comment: NEGATIVE
Comment: NEGATIVE
Comment: NEGATIVE
Comment: NEGATIVE
Comment: NEGATIVE
Comment: NORMAL
Neisseria Gonorrhea: NEGATIVE
Trichomonas: POSITIVE — AB

## 2020-08-14 ENCOUNTER — Encounter (HOSPITAL_COMMUNITY): Payer: Self-pay | Admitting: Family Medicine

## 2020-09-21 ENCOUNTER — Other Ambulatory Visit (INDEPENDENT_AMBULATORY_CARE_PROVIDER_SITE_OTHER): Payer: Self-pay

## 2020-09-21 ENCOUNTER — Other Ambulatory Visit (HOSPITAL_COMMUNITY): Payer: Self-pay | Admitting: Family Medicine

## 2020-09-21 DIAGNOSIS — B3731 Acute candidiasis of vulva and vagina: Secondary | ICD-10-CM

## 2020-09-21 DIAGNOSIS — B373 Candidiasis of vulva and vagina: Secondary | ICD-10-CM

## 2020-10-11 ENCOUNTER — Ambulatory Visit: Payer: Medicaid Other | Admitting: Nurse Practitioner

## 2020-10-18 ENCOUNTER — Other Ambulatory Visit: Payer: Self-pay

## 2020-10-18 ENCOUNTER — Emergency Department (HOSPITAL_COMMUNITY)
Admission: EM | Admit: 2020-10-18 | Discharge: 2020-10-18 | Disposition: A | Discharge status: Home / Self Care | Payer: Medicaid Other | Attending: Emergency Medicine | Admitting: Emergency Medicine

## 2020-10-18 DIAGNOSIS — R1033 Periumbilical pain: Secondary | ICD-10-CM | POA: Diagnosis not present

## 2020-10-18 DIAGNOSIS — Z87891 Personal history of nicotine dependence: Secondary | ICD-10-CM | POA: Insufficient documentation

## 2020-10-18 DIAGNOSIS — R197 Diarrhea, unspecified: Secondary | ICD-10-CM | POA: Insufficient documentation

## 2020-10-18 DIAGNOSIS — R1013 Epigastric pain: Secondary | ICD-10-CM | POA: Diagnosis present

## 2020-10-18 LAB — CBC
HCT: 40.4 % (ref 36.0–46.0)
Hemoglobin: 13 g/dL (ref 12.0–15.0)
MCH: 27.3 pg (ref 26.0–34.0)
MCHC: 32.2 g/dL (ref 30.0–36.0)
MCV: 84.7 fL (ref 80.0–100.0)
Platelets: 358 10*3/uL (ref 150–400)
RBC: 4.77 MIL/uL (ref 3.87–5.11)
RDW: 13.8 % (ref 11.5–15.5)
WBC: 13.5 10*3/uL — ABNORMAL HIGH (ref 4.0–10.5)
nRBC: 0 % (ref 0.0–0.2)

## 2020-10-18 LAB — URINALYSIS, ROUTINE W REFLEX MICROSCOPIC
Bilirubin Urine: NEGATIVE
Glucose, UA: NEGATIVE mg/dL
Hgb urine dipstick: NEGATIVE
Ketones, ur: NEGATIVE mg/dL
Leukocytes,Ua: NEGATIVE
Nitrite: NEGATIVE
Protein, ur: NEGATIVE mg/dL
Specific Gravity, Urine: 1.021 (ref 1.005–1.030)
pH: 7 (ref 5.0–8.0)

## 2020-10-18 LAB — COMPREHENSIVE METABOLIC PANEL
ALT: 16 U/L (ref 0–44)
AST: 14 U/L — ABNORMAL LOW (ref 15–41)
Albumin: 3.7 g/dL (ref 3.5–5.0)
Alkaline Phosphatase: 48 U/L (ref 38–126)
Anion gap: 5 (ref 5–15)
BUN: 6 mg/dL (ref 6–20)
CO2: 25 mmol/L (ref 22–32)
Calcium: 8.9 mg/dL (ref 8.9–10.3)
Chloride: 106 mmol/L (ref 98–111)
Creatinine, Ser: 0.72 mg/dL (ref 0.44–1.00)
GFR, Estimated: >60 mL/min (ref >60)
Glucose, Bld: 126 mg/dL — ABNORMAL HIGH (ref 70–99)
Potassium: 3.8 mmol/L (ref 3.5–5.1)
Sodium: 136 mmol/L (ref 135–145)
Total Bilirubin: 0.2 mg/dL — ABNORMAL LOW (ref 0.3–1.2)
Total Protein: 6.4 g/dL — ABNORMAL LOW (ref 6.5–8.1)

## 2020-10-18 LAB — I-STAT BETA HCG BLOOD, ED (MC, WL, AP ONLY): I-stat hCG, quantitative: <5 m[IU]/mL (ref <5)

## 2020-10-18 LAB — LIPASE, BLOOD: Lipase: 29 U/L (ref 11–51)

## 2020-10-18 MED ORDER — DICYCLOMINE HCL 20 MG PO TABS: 20.0000 mg | ORAL_TABLET | Freq: Every day | ORAL | 0 refills | Status: DC

## 2020-10-18 MED ORDER — DICYCLOMINE HCL 10 MG PO CAPS
10.0000 mg | ORAL_CAPSULE | Freq: Once | ORAL | Status: AC
  Administered 2020-10-18: 10 mg via ORAL
  Filled 2020-10-18: qty 1

## 2020-10-18 NOTE — ED Notes (Signed)
PA at bedside.

## 2020-10-18 NOTE — ED Provider Notes (Signed)
[facility] EMERGENCY DEPARTMENT Provider Note   CSN: [id number] Arrival date & time: 10/18/20  4098     History Chief Complaint  Patient presents with  . Abdominal Pain    Melody Patrick is a 25 y.o. female.  25 y/o with a PMH of IBS presents to the ED to the ED with a chief complaint of abdominal cramping since 4 hours ago.  Reports episodes began last night, felt somewhat cramping above her bellybutton which later improved after having a bowel movement.  Reports morning she attempted to have another bowel movement however was unable to do so.  States she has felt as intermittent aching along the epigastric region with radiation to her back.  Ports she has not taken any medication for improvement in her symptoms.  Was diagnosed with IBS in the past but has not follow-up with a gastroenterologist.  She did have multiple episodes of diarrhea last night, but without any blood in her stool.  Her last menstrual period was sometime at the end of April, without any birth control.  Denies any urinary symptoms, vaginal discharge, nausea, vomiting.  No sick contacts.  The history is provided by the patient.  Abdominal Pain Pain location:  Epigastric Pain quality: aching   Pain radiates to:  Back Pain severity:  Mild Duration:  4 hours Timing:  Intermittent Progression:  Worsening Chronicity:  New Relieved by:  Bowel activity Ineffective treatments:  None tried Associated symptoms: diarrhea   Associated symptoms: no chest pain, no chills, no constipation, no fever, no hematemesis, no melena, no nausea, no shortness of breath, no sore throat, no vaginal bleeding, no vaginal discharge and no vomiting        No past medical history on file.  Patient Active Problem List   Diagnosis Date Noted  . On Depo-Provera for contraception 10/09/2016    Past Surgical History:  Procedure Laterality Date  . WISDOM TOOTH EXTRACTION       OB History    Gravida  1   Para   1   Term  1   Preterm      AB      Living  1     SAB      IAB      Ectopic      Multiple  0   Live Births  1           Family History  Problem Relation Age of Onset  . Hypertension Maternal Grandmother   . Healthy Mother     Social History   Tobacco Use  . Smoking status: Former Smoker    Types: Cigarettes  . Smokeless tobacco: Never Used  Vaping Use  . Vaping Use: Never used  Substance Use Topics  . Alcohol use: Yes    Comment: occasionally  . Drug use: Yes    Types: Marijuana    Home Medications Prior to Admission medications   Medication Sig Start Date End Date Taking? Authorizing Provider  dicyclomine (BENTYL) 20 MG tablet Take 1 tablet (20 mg total) by mouth daily for 7 days. 10/18/20 10/25/20 Yes Shritha Bresee, Leonie Douglas, PA-C  doxycycline (VIBRAMYCIN) 100 MG capsule Take 1 capsule (100 mg total) by mouth 2 (two) times daily. 01/25/20   Wallis Bamberg, PA-C  fluconazole (DIFLUCAN) 150 MG tablet Take 1 tablet (150 mg total) by mouth every three (3) days as needed. 08/06/20   Junie Spencer, FNP  metroNIDAZOLE (FLAGYL) 500 MG tablet Take 1 tablet (500 mg  total) by mouth 2 (two) times daily. 08/10/20   Bing Neighbors, FNP  ondansetron (ZOFRAN-ODT) 8 MG disintegrating tablet Take 1 tablet (8 mg total) by mouth every 8 (eight) hours as needed for nausea or vomiting. 01/25/20   Wallis Bamberg, PA-C  cetirizine (ZYRTEC) 10 MG tablet Take 1 tablet (10 mg total) by mouth 2 (two) times daily. Patient not taking: Reported on 01/23/2020 11/12/19 01/25/20  Eustace Moore, MD    Allergies    Patient has no known allergies.  Review of Systems   Review of Systems  Constitutional: Negative for chills and fever.  HENT: Negative for sore throat.   Respiratory: Negative for shortness of breath.   Cardiovascular: Negative for chest pain.  Gastrointestinal: Positive for abdominal pain and diarrhea. Negative for anal bleeding, blood in stool, constipation, hematemesis, melena, nausea  and vomiting.  Genitourinary: Negative for flank pain, vaginal bleeding and vaginal discharge.  Neurological: Negative for light-headedness and headaches.  All other systems reviewed and are negative.   Physical Exam Updated Vital Signs BP 124/79 (BP Location: Right Arm)   Pulse 75   Temp 98.5 F (36.9 C) (Oral)   Resp 16   Ht 5\' 4"  (1.626 m)   Wt 70.3 kg   LMP 10/11/2020   SpO2 100%   BMI 26.61 kg/m   Physical Exam Vitals and nursing note reviewed.  Constitutional:      Appearance: She is well-developed. She is not ill-appearing or toxic-appearing.  HENT:     Head: Normocephalic and atraumatic.  Eyes:     Extraocular Movements: Extraocular movements intact.     Pupils: Pupils are equal, round, and reactive to light.  Cardiovascular:     Rate and Rhythm: Normal rate.  Pulmonary:     Effort: Pulmonary effort is normal.     Breath sounds: No wheezing or rales.  Abdominal:     General: Abdomen is flat.     Palpations: Abdomen is soft.     Tenderness: There is no abdominal tenderness. There is no right CVA tenderness, left CVA tenderness, guarding or rebound.     Hernia: No hernia is present.     Comments: Abdomen is soft, bowel sounds are present all quadrants, no rebound or guarding noted.  Minimally tender to palpation.  Skin:    General: Skin is warm and dry.  Neurological:     Mental Status: She is alert.     ED Results / Procedures / Treatments   Labs (all labs ordered are listed, but only abnormal results are displayed) Labs Reviewed  COMPREHENSIVE METABOLIC PANEL - Abnormal; Notable for the following components:      Result Value   Glucose, Bld 126 (*)    Total Protein 6.4 (*)    AST 14 (*)    Total Bilirubin 0.2 (*)    All other components within normal limits  CBC - Abnormal; Notable for the following components:   WBC 13.5 (*)    All other components within normal limits  LIPASE, BLOOD  URINALYSIS, ROUTINE W REFLEX MICROSCOPIC  I-STAT BETA HCG  BLOOD, ED ([facility], [facility], [facility] ONLY)    EKG None  Radiology No results found.  Procedures Procedures   Medications Ordered in ED Medications  dicyclomine (BENTYL) capsule 10 mg (10 mg Oral Given 10/18/20 12/18/20)    ED Course  I have reviewed the triage vital signs and the nursing notes.  Pertinent labs & imaging results that were available during my care of the patient  were reviewed by me and considered in my medical decision making (see chart for details).    MDM Rules/Calculators/A&P   Patient presents to the ED with a chief complaint of periumbilical abdominal pain which began 4 hours ago, this is aching in nature.  Previously diagnosed with IBS, currently receives no follow-up by GI.  Has not taken any medication for improvement in her symptoms.  Arrived in the ED afebrile, without any tachycardia or hypoxia and normotensive.  During primary evaluation she is overall nontoxic, non-ill-appearing, vitals are within normal limits.  Exam is benign, bowel sounds are present in all quadrants, there is mild tenderness to palpation on abdomen, this appears somewhat distended.  This for multiple episodes of diarrhea but without any blood in her stool.  No prior surgical history to her abdomen.  Rest of the exam is benign.  Interpretation of her labs reveal a white blood cell count of 13.5, this is at baseline compared to her prior visits.  No signs of anemia.  CMP without any electrolyte derangement, creatinine levels within normal limits.  LFTs markable for slight decrease in her AST, no elevation of ALT.  Lipase level is within normal limits, there is not much tenderness along the upper abdomen.  hCG is also negative.  Suspect as it was relief in pain after bowel movement last night, some suspicion for IBS versus gastroenteritis after multiple episodes of diarrhea.  She was provided with Bentyl to help with abdominal cramping.  Plan on reassessment.  Urinary symptoms, no dysuria or hematuria, no  gynecological complaints.  9:56 AM Spoke to patient who reports improvement in symptoms prior to her cancer intervention.  She does report she has had prior history of acid reflux, we discussed changes in her diet along with PPI over-the-counter.  She also go home on a short course of Bentyl to help with abdominal cramping.  She does not have any urinary symptoms on today's visit, she did provide a urine sample but is requesting discharge due to childcare.  We discussed she will be called if she needs to be placed on any antibiotics.  She remained stable throughout the course without vomiting.  Patient stable for discharge.   Portions of this note were generated with Scientist, clinical (histocompatibility and immunogenetics). Dictation errors may occur despite best attempts at proofreading.  Final Clinical Impression(s) / ED Diagnoses Final diagnoses:  Periumbilical abdominal pain    Rx / DC Orders ED Discharge Orders         Ordered    dicyclomine (BENTYL) 20 MG tablet  Daily        10/18/20 0950           Claude Manges, PA-C 10/18/20 1001    Pricilla Loveless, MD 10/19/20 [phone]

## 2020-10-18 NOTE — Discharge Instructions (Addendum)
Your laboratory results are within normal limits today.  We discussed watching your diet and avoiding caffeine, spicy foods.  I have prescribed medication to help with abdominal cramping, please take this as needed.  You may also purchase some over-the-counter nexium to help with acid reflux.   The number to gastroenterology specialist attached to your chart, please schedule an appointment as needed.

## 2020-10-18 NOTE — ED Triage Notes (Signed)
Pt woke up this morning at 0300 with central abdominal pain with radiation to lower back. Had one episode of diarrhea with relief of pain but woke up at 0600 with recurrence of pain. Endorses nausea without vomiting.

## 2020-10-19 ENCOUNTER — Telehealth: Payer: Self-pay

## 2020-10-19 NOTE — Telephone Encounter (Signed)
Transition Care Management Follow-up Telephone Call  Date of discharge and from where: 10/18/2020 from [facility]  How have you been since you were released from the hospital? Pt stated that she is feeling a lot better today and was able to up rx dicyclomine from the pharmacy.   Any questions or concerns? No  Items Reviewed:  Did the pt receive and understand the discharge instructions provided? Yes   Medications obtained and verified? Yes   Other? No   Any new allergies since your discharge? No   Dietary orders reviewed? n/a  Do you have support at home? Yes   Functional Questionnaire: (I = Independent and D = Dependent) ADLs: I  Bathing/Dressing- I  Meal Prep- I  Eating- I  Maintaining continence- I  Transferring/Ambulation- I  Managing Meds- I   Follow up appointments reviewed:   PCP Hospital f/u appt confirmed? No    Specialist Hospital f/u appt confirmed? No    Are transportation arrangements needed? No   If their condition worsens, is the pt aware to call PCP or go to the Emergency Dept.? Yes  Was the patient provided with contact information for the PCP's office or ED? Yes  Was to pt encouraged to call back with questions or concerns? Yes

## 2021-01-07 ENCOUNTER — Telehealth: Payer: Medicaid Other | Admitting: Family

## 2021-01-07 DIAGNOSIS — N926 Irregular menstruation, unspecified: Secondary | ICD-10-CM

## 2021-01-07 NOTE — Progress Notes (Signed)
Based on what you shared with me, I feel your condition warrants further evaluation and I recommend that you be seen in a face to face visit.  Hello, this is not something we treat over an Evisit. I recommend following up with your PCP or gyn.    NOTE: There will be NO CHARGE for this eVisit   If you are having a true medical emergency please call 911.      For an urgent face to face visit, [place] has six urgent care centers for your convenience:     [facility] Health Urgent Care Center at [facility] Directions [phone] [address] [place], [place] [address]    [facility] Health Urgent Care Center [facility]) Get Driving Directions [phone] [address] [place], [place] [address]  [facility] Health Urgent Care Center [facility]) Get Driving Directions [phone] [address] [place],  [place]  [address]  [facility] Health Urgent Care at [facility] Get Driving Directions [phone] [address], [address] [place], [place] [address]   [facility] Health Urgent Care at [facility] Get Driving Directions  [phone] [address] [place], [place] [address]   [facility] Health Urgent Care at [facility] Directions [phone] [address] [place], [place] [address]  Your MyChart E-visit questionnaire answers were reviewed by a board certified advanced clinical practitioner to complete your personal care plan based on your specific symptoms.  Thank you for using e-Visits.

## 2021-03-25 ENCOUNTER — Other Ambulatory Visit: Payer: Self-pay | Admitting: Family Medicine

## 2021-04-02 ENCOUNTER — Telehealth: Payer: Medicaid Other | Admitting: Physician Assistant

## 2021-04-02 DIAGNOSIS — H04302 Unspecified dacryocystitis of left lacrimal passage: Secondary | ICD-10-CM | POA: Diagnosis not present

## 2021-04-02 MED ORDER — POLYMYXIN B-TRIMETHOPRIM 10000-0.1 UNIT/ML-% OP SOLN: 1.0000 [drp] | OPHTHALMIC | 0 refills | Status: DC

## 2021-04-02 NOTE — Progress Notes (Signed)
Virtual Visit Consent   Melody Patrick, you are scheduled for a virtual visit with a [facility] provider today.     Just as with appointments in the office, your consent must be obtained to participate.  Your consent will be active for this visit and any virtual visit you may have with one of our providers in the next 365 days.     If you have a MyChart account, a copy of this consent can be sent to you electronically.  All virtual visits are billed to your insurance company just like a traditional visit in the office.    As this is a virtual visit, video technology does not allow for your provider to perform a traditional examination.  This may limit your provider's ability to fully assess your condition.  If your provider identifies any concerns that need to be evaluated in person or the need to arrange testing (such as labs, EKG, etc.), we will make arrangements to do so.     Although advances in technology are sophisticated, we cannot ensure that it will always work on either your end or our end.  If the connection with a video visit is poor, the visit may have to be switched to a telephone visit.  With either a video or telephone visit, we are not always able to ensure that we have a secure connection.     I need to obtain your verbal consent now.   Are you willing to proceed with your visit today?    [name] has provided verbal consent on 04/02/2021 for a virtual visit (video or telephone).   Margaretann Loveless, PA-C   Date: 04/02/2021 9:43 AM   Virtual Visit via Video Note   I, Margaretann Loveless, connected with  Melody Patrick  ([id number], June 19, 1995) on 04/02/21 at  9:30 AM EDT by a video-enabled telemedicine application and verified that I am speaking with the correct person using two identifiers.  Location: Patient: Virtual Visit Location Patient: Home Provider: Virtual Visit Location Provider: Home Office   I discussed the limitations of evaluation and  management by telemedicine and the availability of in person appointments. The patient expressed understanding and agreed to proceed.    History of Present Illness: Melody Patrick is a 25 y.o. who identifies as a female who was assigned female at birth, and is being seen today for possible pink eye.  HPI: Conjunctivitis  The current episode started yesterday. The onset was sudden. The problem occurs continuously. The problem has been gradually worsening. The problem is mild. Nothing relieves the symptoms. The symptoms are aggravated by light. Associated symptoms include photophobia, rhinorrhea (only left nostril when eye tears up), eye discharge (clear), eye pain and eye redness. Pertinent negatives include no fever, no decreased vision, no double vision, no eye itching, no ear discharge and no ear pain. The eye pain is mild. The left eye is affected. The eye pain is not associated with movement.     Problems:  Patient Active Problem List   Diagnosis Date Noted   On Depo-Provera for contraception 10/09/2016    Allergies: No Known Allergies Medications:  Current Outpatient Medications:    dicyclomine (BENTYL) 20 MG tablet, Take 1 tablet (20 mg total) by mouth daily for 7 days., Disp: 7 tablet, Rfl: 0   doxycycline (VIBRAMYCIN) 100 MG capsule, Take 1 capsule (100 mg total) by mouth 2 (two) times daily., Disp: 14 capsule, Rfl: 0   fluconazole (DIFLUCAN) 150 MG  tablet, Take 1 tablet (150 mg total) by mouth every three (3) days as needed., Disp: 3 tablet, Rfl: 0   metroNIDAZOLE (FLAGYL) 500 MG tablet, Take 1 tablet (500 mg total) by mouth 2 (two) times daily., Disp: 14 tablet, Rfl: 0   ondansetron (ZOFRAN-ODT) 8 MG disintegrating tablet, Take 1 tablet (8 mg total) by mouth every 8 (eight) hours as needed for nausea or vomiting., Disp: 14 tablet, Rfl: 0   trimethoprim-polymyxin b (POLYTRIM) ophthalmic solution, Place 1 drop into the left eye every 4 (four) hours., Disp: 10 mL, Rfl:  0  Observations/Objective: Patient is well-developed, well-nourished in no acute distress.  Resting comfortably at home.  Head is normocephalic, atraumatic.  No labored breathing.  Speech is clear and coherent with logical content.  Patient is alert and oriented at baseline.  Left eye is red and irritated with clear drainage  Assessment and Plan: 1. Tear duct infection, left - trimethoprim-polymyxin b (POLYTRIM) ophthalmic solution; Place 1 drop into the left eye every 4 (four) hours.  Dispense: 10 mL; Refill: 0  - Will treat with Polytrim - Warm compresses - Tylenol as needed for pain or fevers - Seek in person evaluation if pain persists or worsens  Follow Up Instructions: I discussed the assessment and treatment plan with the patient. The patient was provided an opportunity to ask questions and all were answered. The patient agreed with the plan and demonstrated an understanding of the instructions.  A copy of instructions were sent to the patient via MyChart unless otherwise noted below.   The patient was advised to call back or seek an in-person evaluation if the symptoms worsen or if the condition fails to improve as anticipated.  Time:  I spent 14 minutes with the patient via telehealth technology discussing the above problems/concerns.    Margaretann Loveless, PA-C

## 2021-04-02 NOTE — Patient Instructions (Signed)
Redmond Baseman, thank you for joining Margaretann Loveless, PA-C for today's virtual visit.  While this provider is not your primary care provider (PCP), if your PCP is located in our provider database this encounter information will be shared with them immediately following your visit.  Consent: (Patient) Melody Patrick provided verbal consent for this virtual visit at the beginning of the encounter.  Current Medications:  Current Outpatient Medications:    dicyclomine (BENTYL) 20 MG tablet, Take 1 tablet (20 mg total) by mouth daily for 7 days., Disp: 7 tablet, Rfl: 0   doxycycline (VIBRAMYCIN) 100 MG capsule, Take 1 capsule (100 mg total) by mouth 2 (two) times daily., Disp: 14 capsule, Rfl: 0   fluconazole (DIFLUCAN) 150 MG tablet, Take 1 tablet (150 mg total) by mouth every three (3) days as needed., Disp: 3 tablet, Rfl: 0   metroNIDAZOLE (FLAGYL) 500 MG tablet, Take 1 tablet (500 mg total) by mouth 2 (two) times daily., Disp: 14 tablet, Rfl: 0   ondansetron (ZOFRAN-ODT) 8 MG disintegrating tablet, Take 1 tablet (8 mg total) by mouth every 8 (eight) hours as needed for nausea or vomiting., Disp: 14 tablet, Rfl: 0   Medications ordered in this encounter:  No orders of the defined types were placed in this encounter.    *If you need refills on other medications prior to your next appointment, please contact your pharmacy*  Follow-Up: Call back or seek an in-person evaluation if the symptoms worsen or if the condition fails to improve as anticipated.  Other Instructions Dacryocystitis Dacryocystitis is an infection of the sac that collects tears (lacrimal sac). The lacrimal sac is located between the inner corner of the eye and the nose. The glands of the eyelids make tears that keep the surface of the eye wet and protected. Tears drain from two small tubes (ducts) in the eyelids. These ducts carry tears to the lacrimal sac. Another tube (nasolacrimal duct) carries tears from the  lacrimal sac down into the back of the nose to the throat. Dacryocystitis can be sudden (acute) or long-lasting (chronic). It usually affects only one eye. What are the causes? The most common cause of this condition is a blocked nasolacrimal duct. When this duct is blocked, tears cannot drain into the nose, and tears become backed up in the lacrimal sac. Bacteria that normally live in the eye, on the skin, or in the nose start to grow inside the sac and cause infection. The nasolacrimal duct may become blocked because of: A nose or sinus infection that spreads into the duct. A duct that is abnormally shaped (malformed). A growth or swelling in the nose. An injury or surgery that narrows or scars the duct. Dacryocystitis also may start as an eye infection that spreads to the lacrimal sac. Sometimes, the cause of dacryocystitis is not known. What increases the risk? You are more likely to develop this condition if you: Are older than 25 years of age. Are female. Women tend to have a narrower nasolacrimal duct than men. Have had nasal trauma, such as a broken nose or nasal surgery. Have nasal polyps. What are the signs or symptoms? Symptoms of acute dacryocystitis start suddenly and may include: Excessive tearing. A matted, watery eye. Swelling and redness over the lacrimal sac. Discharge of mucus or pus into the eye. This may cause blurred vision. Eye pain. A fever. Symptoms of chronic dacryocystitis usually include: More tearing than usual. Discharge of mucus or pus into the eye. Blurred vision.  Redness, pain, and swelling are less common with chronic dacryocystitis. How is this diagnosed? This condition is diagnosed based on your medical history and a physical exam. During the exam, your health care provider may press between your eye and the side of your nose to see if discharge flows back into your eye. You may also have tests, such as: Removal of a sample of discharge from your eye  or nose to check for infection. A test where your health care provider will put a yellow dye in your eye to see if the dye disappears from your eye (dye disappearance test). A swab may be placed in your nose to see if the dye drains to your nose. A test where a thin, lighted scope (endoscope) is placed in your nose to determine what is causing the duct blockage (nasal endoscopy). How is this treated? Acute dacryocystitis is treated with antibiotic medicines. These are usually given by mouth (orally), but they can also be given as eye drops or ointments. If the infection has spread to tissues around the eye (orbital cellulitis), antibiotics may be given through an IV. Chronic dacryocystitis usually needs to be treated with surgery. Surgical options include: Probing the duct to open it. Widening the duct. Removing a nasal blockage. Follow these instructions at home: Medicines Take over-the-counter and prescription medicines only as told by your health care provider. Take or apply your antibiotic medicine, drops, or ointment as told by your health care provider. Do not stop taking or applying the antibiotic even if you start to feel better. General instructions If directed by your health care provider, apply a clean, warm compress to the inside corner of your eye. To do this: Wash your hands first. Hold the compress over the inside corner of your eye for a few minutes. Repeat this every few hours during the day. Keep all follow-up visits as told by your health care provider. This is important. Contact a health care provider if: You have a fever. Your symptoms come back, do not improve, or get worse. Get help right away if you have: Redness, swelling, and pain that spread to the tissues around your eye. A sudden decrease in your vision. Summary Dacryocystitis can be sudden (acute) or long-lasting (chronic). The most common cause of this condition is a blocked nasolacrimal duct. Acute  dacryocystitis is treated with antibiotic medicines. Chronic dacryocystitis usually needs to be treated with surgery. Keep all follow-up visits as told by your health care provider. This is important. This information is not intended to replace advice given to you by your health care provider. Make sure you discuss any questions you have with your health care provider. Document Revised: 04/28/2018 Document Reviewed: 04/28/2018 Elsevier Patient Education  2022 [employer].    If you have been instructed to have an in-person evaluation today at a local Urgent Care facility, please use the link below. It will take you to a list of all of our available [facility], including address, phone number and hours of operation. Please do not delay care.  [facility]  If you or a family member do not have a primary care provider, use the link below to schedule a visit and establish care. When you choose a [facility] primary care physician or advanced practice provider, you gain a long-term partner in health. Find a Primary Care Provider  Learn more about [facility] in-office and virtual care options: [facility] - Get Care Now

## 2021-04-21 ENCOUNTER — Encounter (HOSPITAL_COMMUNITY): Payer: Self-pay

## 2021-04-21 ENCOUNTER — Ambulatory Visit (HOSPITAL_COMMUNITY)
Admission: RE | Admit: 2021-04-21 | Discharge: 2021-04-21 | Disposition: A | Discharge status: Home / Self Care | Payer: Medicaid Other | Source: Ambulatory Visit | Attending: Student | Admitting: Student

## 2021-04-21 ENCOUNTER — Other Ambulatory Visit: Payer: Self-pay

## 2021-04-21 VITALS — BP 122/79 | HR 67 | Temp 98.0°F | Resp 16

## 2021-04-21 DIAGNOSIS — Z113 Encounter for screening for infections with a predominantly sexual mode of transmission: Secondary | ICD-10-CM

## 2021-04-21 DIAGNOSIS — N760 Acute vaginitis: Secondary | ICD-10-CM | POA: Insufficient documentation

## 2021-04-21 MED ORDER — METRONIDAZOLE 500 MG PO TABS: 500.0000 mg | ORAL_TABLET | Freq: Two times a day (BID) | ORAL | 0 refills | Status: DC

## 2021-04-21 NOTE — Discharge Instructions (Addendum)
-  For bacterial vaginosis, start the antibiotic-Flagyl (metronidazole), 2 pills daily for 7 days.  You can take this with food if you have a sensitive stomach.  Avoid alcohol while taking this medication and for 2 days after as this will cause severe nausea and vomiting. -We'll call in 2-3 days if we need to send any other medications.

## 2021-04-21 NOTE — ED Provider Notes (Signed)
[facility]    CSN: [id number] Arrival date & time: 04/21/21  1648      History   Chief Complaint Chief Complaint  Patient presents with   Appointment   STD's test    HPI Melody Patrick is a 25 y.o. female presenting with suspected BV following intercourse. Medical history noncontributory. Describes having anal sex followed by vaginal sex, now with external vaginal irritation. Denies hematuria, dysuria, frequency, urgency, back pain, n/v/d/abd pain, fevers/chills, abdnormal vaginal discharge, vaginal rashes/lesions.Marland Kitchen    HPI  History reviewed. No pertinent past medical history.  Patient Active Problem List   Diagnosis Date Noted   On Depo-Provera for contraception 10/09/2016    Past Surgical History:  Procedure Laterality Date   WISDOM TOOTH EXTRACTION      OB History     Gravida  1   Para  1   Term  1   Preterm      AB      Living  1      SAB      IAB      Ectopic      Multiple  0   Live Births  1            Home Medications    Prior to Admission medications   Medication Sig Start Date End Date Taking? Authorizing Provider  metroNIDAZOLE (FLAGYL) 500 MG tablet Take 1 tablet (500 mg total) by mouth 2 (two) times daily. Avoid alcohol while taking this medication and for 2 days after 04/21/21  Yes Rhys Martini, PA-C  dicyclomine (BENTYL) 20 MG tablet Take 1 tablet (20 mg total) by mouth daily for 7 days. 10/18/20 10/25/20  Claude Manges, PA-C  doxycycline (VIBRAMYCIN) 100 MG capsule Take 1 capsule (100 mg total) by mouth 2 (two) times daily. 01/25/20   Wallis Bamberg, PA-C  fluconazole (DIFLUCAN) 150 MG tablet Take 1 tablet (150 mg total) by mouth every three (3) days as needed. 08/06/20   Junie Spencer, FNP  ondansetron (ZOFRAN-ODT) 8 MG disintegrating tablet Take 1 tablet (8 mg total) by mouth every 8 (eight) hours as needed for nausea or vomiting. 01/25/20   Wallis Bamberg, PA-C  trimethoprim-polymyxin b (POLYTRIM) ophthalmic solution  Place 1 drop into the left eye every 4 (four) hours. 04/02/21   Margaretann Loveless, PA-C  cetirizine (ZYRTEC) 10 MG tablet Take 1 tablet (10 mg total) by mouth 2 (two) times daily. Patient not taking: Reported on 01/23/2020 11/12/19 01/25/20  Eustace Moore, MD    Family History Family History  Problem Relation Age of Onset   Hypertension Maternal Grandmother    Healthy Mother     Social History Social History   Tobacco Use   Smoking status: Former    Types: Cigarettes   Smokeless tobacco: Never  Vaping Use   Vaping Use: Never used  Substance Use Topics   Alcohol use: Yes    Comment: occasionally   Drug use: Yes    Types: Marijuana     Allergies   Patient has no known allergies.   Review of Systems Review of Systems  Constitutional:  Negative for appetite change, chills, diaphoresis and fever.  Respiratory:  Negative for shortness of breath.   Cardiovascular:  Negative for chest pain.  Gastrointestinal:  Negative for abdominal pain, blood in stool, constipation, diarrhea, nausea and vomiting.  Genitourinary:  Negative for decreased urine volume, difficulty urinating, dysuria, flank pain, frequency, genital sores, hematuria, urgency, vaginal bleeding, vaginal discharge  and vaginal pain.  Musculoskeletal:  Negative for back pain.  Neurological:  Negative for dizziness, weakness and light-headedness.  All other systems reviewed and are negative.   Physical Exam Triage Vital Signs ED Triage Vitals  Enc Vitals Group     BP 04/21/21 1807 122/79     Pulse Rate 04/21/21 1807 67     Resp 04/21/21 1807 16     Temp 04/21/21 1807 98 F (36.7 C)     Temp Source 04/21/21 1807 Oral     SpO2 04/21/21 1807 99 %     Weight --      Height --      Head Circumference --      Peak Flow --      Pain Score 04/21/21 1806 0     Pain Loc --      Pain Edu? --      Excl. in [place]? --    No data found.  Updated Vital Signs BP 122/79 (BP Location: Right Arm)   Pulse 67   Temp 98  F (36.7 C) (Oral)   Resp 16   SpO2 99%   Visual Acuity Right Eye Distance:   Left Eye Distance:   Bilateral Distance:    Right Eye Near:   Left Eye Near:    Bilateral Near:     Physical Exam Vitals reviewed.  Constitutional:      General: She is not in acute distress.    Appearance: Normal appearance. She is not ill-appearing.  HENT:     Head: Normocephalic and atraumatic.     Mouth/Throat:     Mouth: Mucous membranes are moist.     Comments: Moist mucous membranes Eyes:     Extraocular Movements: Extraocular movements intact.     Pupils: Pupils are equal, round, and reactive to light.  Cardiovascular:     Rate and Rhythm: Normal rate and regular rhythm.     Heart sounds: Normal heart sounds.  Pulmonary:     Effort: Pulmonary effort is normal.     Breath sounds: Normal breath sounds. No wheezing, rhonchi or rales.  Abdominal:     General: Bowel sounds are normal. There is no distension.     Palpations: Abdomen is soft. There is no mass.     Tenderness: There is no abdominal tenderness. There is no right CVA tenderness, left CVA tenderness, guarding or rebound.  Genitourinary:    Comments: deferred Skin:    General: Skin is warm.     Capillary Refill: Capillary refill takes less than 2 seconds.     Comments: Good skin turgor  Neurological:     General: No focal deficit present.     Mental Status: She is alert and oriented to person, place, and time.  Psychiatric:        Mood and Affect: Mood normal.        Behavior: Behavior normal.     UC Treatments / Results  Labs (all labs ordered are listed, but only abnormal results are displayed) Labs Reviewed  CERVICOVAGINAL ANCILLARY ONLY    EKG   Radiology No results found.  Procedures Procedures (including critical care time)  Medications Ordered in UC Medications - No data to display  Initial Impression / Assessment and Plan / UC Course  I have reviewed the triage vital signs and the nursing  notes.  Pertinent labs & imaging results that were available during my care of the patient were reviewed by me and considered in my medical  decision making (see chart for details).     This patient is a very pleasant 25 y.o. year old female presenting with suspected BV. Afebrile, nontachycardic, no reproducible abd pain or CVAT.  Will send self-swab for G/C, trich, yeast, BV testing. Declines HIV, RPR. Safe sex precautions.  Finished her period 1 day ago so u-preg declined.  Flagyl sent.   ED return precautions discussed. Patient verbalizes understanding and agreement.     Final Clinical Impressions(s) / UC Diagnoses   Final diagnoses:  Vaginitis and vulvovaginitis  Routine screening for STI (sexually transmitted infection)     Discharge Instructions      -For bacterial vaginosis, start the antibiotic-Flagyl (metronidazole), 2 pills daily for 7 days.  You can take this with food if you have a sensitive stomach.  Avoid alcohol while taking this medication and for 2 days after as this will cause severe nausea and vomiting. -We'll call in 2-3 days if we need to send any other medications.   ED Prescriptions     Medication Sig Dispense Auth. Provider   metroNIDAZOLE (FLAGYL) 500 MG tablet Take 1 tablet (500 mg total) by mouth 2 (two) times daily. Avoid alcohol while taking this medication and for 2 days after 14 tablet Hazel Sams, PA-C      PDMP not reviewed this encounter.   Hazel Sams, PA-C 04/21/21 1856

## 2021-04-21 NOTE — ED Triage Notes (Signed)
Pt requested STD's test.  

## 2021-04-23 ENCOUNTER — Encounter: Payer: Self-pay | Admitting: Physician Assistant

## 2021-04-23 LAB — CERVICOVAGINAL ANCILLARY ONLY
Bacterial Vaginitis (gardnerella): POSITIVE — AB
Candida Glabrata: NEGATIVE
Candida Vaginitis: NEGATIVE
Chlamydia: NEGATIVE
Comment: NEGATIVE
Comment: NEGATIVE
Comment: NEGATIVE
Comment: NEGATIVE
Comment: NEGATIVE
Comment: NORMAL
Neisseria Gonorrhea: NEGATIVE
Trichomonas: POSITIVE — AB

## 2021-04-24 ENCOUNTER — Telehealth: Payer: Medicaid Other | Admitting: Physician Assistant

## 2021-04-24 ENCOUNTER — Encounter: Payer: Self-pay | Admitting: Physician Assistant

## 2021-04-24 NOTE — Progress Notes (Signed)
Message sent to patient requesting further input regarding current symptoms. Awaiting patient response.  

## 2021-04-27 ENCOUNTER — Ambulatory Visit (HOSPITAL_COMMUNITY): Payer: Medicaid Other

## 2021-05-03 ENCOUNTER — Ambulatory Visit (HOSPITAL_COMMUNITY): Payer: Medicaid Other

## 2021-05-04 ENCOUNTER — Encounter (HOSPITAL_COMMUNITY): Payer: Self-pay

## 2021-05-04 ENCOUNTER — Other Ambulatory Visit: Payer: Self-pay

## 2021-05-04 ENCOUNTER — Ambulatory Visit (HOSPITAL_COMMUNITY)
Admission: RE | Admit: 2021-05-04 | Discharge: 2021-05-04 | Disposition: A | Discharge status: Home / Self Care | Payer: Medicaid Other | Source: Ambulatory Visit | Attending: Student | Admitting: Student

## 2021-05-04 VITALS — BP 120/87 | HR 77 | Temp 98.6°F | Resp 16

## 2021-05-04 DIAGNOSIS — K2900 Acute gastritis without bleeding: Secondary | ICD-10-CM | POA: Diagnosis not present

## 2021-05-04 DIAGNOSIS — Z3202 Encounter for pregnancy test, result negative: Secondary | ICD-10-CM | POA: Insufficient documentation

## 2021-05-04 HISTORY — DX: Irritable bowel syndrome, unspecified: K58.9

## 2021-05-04 LAB — POC URINE PREG, ED: Preg Test, Ur: NEGATIVE

## 2021-05-04 MED ORDER — OMEPRAZOLE 20 MG PO CPDR: 20.0000 mg | DELAYED_RELEASE_CAPSULE | Freq: Every day | ORAL | 0 refills | Status: DC

## 2021-05-04 NOTE — ED Provider Notes (Signed)
[facility]    CSN: [id number] Arrival date & time: 05/04/21  1353      History   Chief Complaint Chief Complaint  Patient presents with   Abdominal Pain    HPI Melody Patrick is a 25 y.o. female presenting with abd pain following treatment with flagyl. Medical history BV and trichomonas 04/21/21, she was treated appropriately with flagyl, but she stopped this on day 4 due to stomach upset. She was concurrently taking ibuprofen for dental pain and thinks this might have been the problem.  States that she was having abdominal pain and burning within the stomach, and so she stopped the medications.  States her vaginitis has resolved, and denies current abdominal pain, vaginal discharge, dysuria, etc.  Denies new partners.  HPI  Past Medical History:  Diagnosis Date   IBS (irritable bowel syndrome)     Patient Active Problem List   Diagnosis Date Noted   On Depo-Provera for contraception 10/09/2016    Past Surgical History:  Procedure Laterality Date   WISDOM TOOTH EXTRACTION      OB History     Gravida  1   Para  1   Term  1   Preterm      AB      Living  1      SAB      IAB      Ectopic      Multiple  0   Live Births  1            Home Medications    Prior to Admission medications   Medication Sig Start Date End Date Taking? Authorizing Provider  omeprazole (PRILOSEC) 20 MG capsule Take 1 capsule (20 mg total) by mouth daily. 05/04/21  Yes Rhys Martini, PA-C  cetirizine (ZYRTEC) 10 MG tablet Take 1 tablet (10 mg total) by mouth 2 (two) times daily. Patient not taking: Reported on 01/23/2020 11/12/19 01/25/20  Eustace Moore, MD    Family History Family History  Problem Relation Age of Onset   Hypertension Maternal Grandmother    Healthy Mother     Social History Social History   Tobacco Use   Smoking status: Former    Types: Cigarettes   Smokeless tobacco: Never  Vaping Use   Vaping Use: Never used   Substance Use Topics   Alcohol use: Yes    Comment: occasionally   Drug use: Not Currently    Types: Marijuana     Allergies   Patient has no known allergies.   Review of Systems Review of Systems  Gastrointestinal:  Positive for abdominal pain.  All other systems reviewed and are negative.   Physical Exam Triage Vital Signs ED Triage Vitals  Enc Vitals Group     BP 05/04/21 1414 120/87     Pulse Rate 05/04/21 1414 77     Resp 05/04/21 1414 16     Temp 05/04/21 1414 98.6 F (37 C)     Temp Source 05/04/21 1414 Oral     SpO2 05/04/21 1414 98 %     Weight --      Height --      Head Circumference --      Peak Flow --      Pain Score 05/04/21 1415 0     Pain Loc --      Pain Edu? --      Excl. in [place]? --    No data found.  Updated Vital Signs  BP 120/87 (BP Location: Right Arm)   Pulse 77   Temp 98.6 F (37 C) (Oral)   Resp 16   LMP 04/18/2021   SpO2 98%   Visual Acuity Right Eye Distance:   Left Eye Distance:   Bilateral Distance:    Right Eye Near:   Left Eye Near:    Bilateral Near:     Physical Exam Vitals reviewed.  Constitutional:      General: She is not in acute distress.    Appearance: Normal appearance. She is not ill-appearing.  HENT:     Head: Normocephalic and atraumatic.     Mouth/Throat:     Mouth: Mucous membranes are moist.     Comments: Moist mucous membranes Eyes:     Extraocular Movements: Extraocular movements intact.     Pupils: Pupils are equal, round, and reactive to light.  Cardiovascular:     Rate and Rhythm: Normal rate and regular rhythm.     Heart sounds: Normal heart sounds.  Pulmonary:     Effort: Pulmonary effort is normal.     Breath sounds: Normal breath sounds. No wheezing, rhonchi or rales.  Abdominal:     General: Bowel sounds are normal. There is no distension.     Palpations: Abdomen is soft. There is no mass.     Tenderness: There is abdominal tenderness in the epigastric area. There is no right CVA  tenderness, left CVA tenderness, guarding or rebound. Negative signs include Murphy's sign, Rovsing's sign and McBurney's sign.     Comments: Epigastric pain to deep palpation, without guarding or rebound. No mass. Comfortable throughout exam.  Skin:    General: Skin is warm.     Capillary Refill: Capillary refill takes less than 2 seconds.     Comments: Good skin turgor  Neurological:     General: No focal deficit present.     Mental Status: She is alert and oriented to person, place, and time.  Psychiatric:        Mood and Affect: Mood normal.        Behavior: Behavior normal.     UC Treatments / Results  Labs (all labs ordered are listed, but only abnormal results are displayed) Labs Reviewed  POC URINE PREG, ED  CERVICOVAGINAL ANCILLARY ONLY    EKG   Radiology No results found.  Procedures Procedures (including critical care time)  Medications Ordered in UC Medications - No data to display  Initial Impression / Assessment and Plan / UC Course  I have reviewed the triage vital signs and the nursing notes.  Pertinent labs & imaging results that were available during my care of the patient were reviewed by me and considered in my medical decision making (see chart for details).     This patient is a very pleasant 25 y.o. year old female presenting with gastritis following taking flagyl and ibuprofen. Reassuring exam today.  She was recently positive for both trichomonas and BV, will retest today to make sure that she completed adequate course of the Flagyl.Marland Kitchen u-preg negative. Prilosec as below. ED return precautions discussed. Patient verbalizes understanding and agreement.    Final Clinical Impressions(s) / UC Diagnoses   Final diagnoses:  Negative pregnancy test  Acute gastritis without hemorrhage, unspecified gastritis type     Discharge Instructions      -Prilosec one pill daily x14 days -We'll call in 2 days if any positive lab results     ED  Prescriptions     Medication  Sig Dispense Auth. Provider   omeprazole (PRILOSEC) 20 MG capsule Take 1 capsule (20 mg total) by mouth daily. 14 capsule Rhys Martini, PA-C      PDMP not reviewed this encounter.   Rhys Martini, PA-C 05/04/21 1433

## 2021-05-04 NOTE — Discharge Instructions (Addendum)
-  Prilosec one pill daily x14 days -We'll call in 2 days if any positive lab results

## 2021-05-04 NOTE — ED Triage Notes (Signed)
Pt states she was seen here and was being tx for BV. Pt was on Flagyl but unable to continue medication after day 3 due to abd pain. Denies BV sx but does have abd pain when she gets hungry.

## 2021-05-07 LAB — CERVICOVAGINAL ANCILLARY ONLY
Bacterial Vaginitis (gardnerella): NEGATIVE
Candida Glabrata: NEGATIVE
Candida Vaginitis: NEGATIVE
Chlamydia: NEGATIVE
Comment: NEGATIVE
Comment: NEGATIVE
Comment: NEGATIVE
Comment: NEGATIVE
Comment: NEGATIVE
Comment: NORMAL
Neisseria Gonorrhea: NEGATIVE
Trichomonas: NEGATIVE

## 2021-05-11 ENCOUNTER — Telehealth: Payer: Medicaid Other | Admitting: Nurse Practitioner

## 2021-05-11 DIAGNOSIS — L309 Dermatitis, unspecified: Secondary | ICD-10-CM | POA: Diagnosis not present

## 2021-05-11 MED ORDER — TRIAMCINOLONE ACETONIDE 0.1 % EX CREA
1.0000 | TOPICAL_CREAM | Freq: Two times a day (BID) | CUTANEOUS | 0 refills | Status: DC
Start: 2021-05-11 — End: 2021-08-28

## 2021-05-11 NOTE — Progress Notes (Signed)
E-Visit for Eczema  We are sorry that you are not feeling well. Here is how we plan to help! Based on what you shared with me it looks like you have eczema (atopic dermatitis).  Although the cause of eczema is not completely understood, genetics appear to play a strong role, and people with a family history of eczema are at increased risk of developing the condition. In most people with eczema, there is a genetic abnormality in the outermost layer of the skin, called the epidermis   Most people with eczema develop their first symptoms as children, before the age of 53. Intense itching of the skin, patches of redness, small bumps, and skin flaking are common. Scratching can further inflame the skin and worsen the itching. The itchiness may be more noticeable at nighttime.  Eczema commonly affects the back of the neck, the elbow creases, and the backs of the knees. Other affected areas may include the face, wrists, and forearms. The skin may become thickened and darkened, or even scarred, from repeated scratching. Eliminating factors that aggravate your eczema symptoms can help to control the symptoms. Possible triggers may include: ? Cold or dry environments ? Sweating ? Emotional stress or anxiety ? Rapid temperature changes ? Exposure to certain chemicals or cleaning solutions, including soaps and detergents, perfumes and cosmetics, wool or synthetic fibers, dust, sand, and cigarette smoke Keeping your skin hydrated Emollients -- Emollients are creams and ointments that moisturize the skin and prevent it from drying out. The best emollients for people with eczema are thick creams (such as Eucerin, Cetaphil, and Nutraderm) or ointments (such as petroleum jelly, Aquaphor, and Vaseline), which contain little to no water. Emollients are most effective when applied immediately after bathing. Emollients can be applied twice a day or more often if needed. Lotions contain more water than creams and  ointments and are less effective for moisturizing the skin. Bathing -- It is not clear if showers or baths are better for keeping the skin hydrated. Lukewarm baths or showers can hydrate and cool the skin, temporarily relieving itching from eczema. An unscented, mild soap or non-soap cleanser (such as Cetaphil) should be used sparingly. Apply an emollient immediately after bathing or showering to prevent your skin from drying out as a result of water evaporation. Emollient bath additives (products you add to the bath water) have not been found to help relieve symptoms. Hot or long baths (more than 10 to 15 minutes) and showers should be avoided since they can dry out the skin.  Based on what you shared with me you may have eczema.   Triamcinalone ointment (or cream). Apply to the effected areas twice per day.   I recommend dilute bleach baths for people with eczema. These baths help to decrease the number of bacteria on the skin that can cause infections or worsen symptoms. To prepare a bleach bath, one-fourth to one-half cup of bleach is placed in a full bathtub (about 40 gallons) of water. Bleach baths are usually taken for 5 to 10 minutes twice per week and should be followed by application of an emollient (listed above). I recommend you take Benadryl 25mg  - 50mg  every 4 hours to control the symptoms (including itching) but if they last over 24 hours it is best that you see an office based provider for follow up.  HOME CARE: Take lukewarm showers or baths Apply creams and ointments to prevent the skin from drying (Eucerin, Cetaphil, Nutraderm, petroleum jelly, Aquaphor or Vaseline) - these  products contain less water than other lotions and are more effective for moisturizing the skin Limit exposure to cold or dry environments, sweating, emotional stress and anxiety, rapid temperature changes and exposure to chemicals and cleaning products, soaps and detergents, perfumes, cosmetics, wool and  synthetic fibers, dust, sand and cigarette- factors which can aggravate eczema symptoms.  Use a hydrocortisone cream once or twice a day Take an antihistamine like Benadryl for widespread rashes that itch.  The adult dosage of Benadryl is 25-50 mg by mouth 4 times daily. Caution: This type of medication may cause sleepiness.  Do not drink alcohol, drive, or operate dangerous machinery while taking antihistamines.  Do not take these medications if you have prostate enlargement.  Read the package instructions thoroughly on all medications that you take.  GET HELP RIGHT AWAY IF: Symptoms that don't go away after treatment. Severe itching that persists. You develop a fever. Your skin begins to drain. You have a sore throat. You become short of breath.  MAKE SURE YOU   Understand these instructions. Will watch your condition. Will get help right away if you are not doing well or get worse.    Thank you for choosing an e-visit.  Your e-visit answers were reviewed by a board certified advanced clinical practitioner to complete your personal care plan. Depending upon the condition, your plan could have included both over the counter or prescription medications.  Please review your pharmacy choice. Make sure the pharmacy is open so you can pick up prescription now. If there is a problem, you may contact your provider through [employer] and have the prescription routed to another pharmacy.  Your safety is important to [place]. If you have drug allergies check your prescription carefully.   For the next 24 hours you can use MyChart to ask questions about today's visit, request a non-urgent call back, or ask for a work or school excuse. You will get an email in the next two days asking about your experience. I hope that your e-visit has been valuable and will speed your recovery.   Meds ordered this encounter  Medications   triamcinolone cream (KENALOG) 0.1 %    Sig: Apply 1 application  topically 2 (two) times daily.    Dispense:  30 g    Refill:  0   I spent approximately 7 minutes reviewing the patient's history, current symptoms and coordinating their plan of care today.

## 2021-05-31 ENCOUNTER — Other Ambulatory Visit: Payer: Self-pay

## 2021-05-31 ENCOUNTER — Ambulatory Visit (HOSPITAL_COMMUNITY)
Admission: RE | Admit: 2021-05-31 | Discharge: 2021-05-31 | Disposition: A | Discharge status: Home / Self Care | Payer: Medicaid Other | Source: Ambulatory Visit | Attending: Internal Medicine | Admitting: Internal Medicine

## 2021-05-31 ENCOUNTER — Encounter (HOSPITAL_COMMUNITY): Payer: Self-pay

## 2021-05-31 VITALS — BP 126/79 | HR 81 | Temp 98.1°F | Resp 16

## 2021-05-31 DIAGNOSIS — N926 Irregular menstruation, unspecified: Secondary | ICD-10-CM | POA: Insufficient documentation

## 2021-05-31 LAB — POC URINE PREG, ED: Preg Test, Ur: NEGATIVE

## 2021-05-31 LAB — HCG, QUANTITATIVE, PREGNANCY: hCG, Beta Chain, Quant, S: <1 m[IU]/mL (ref <5)

## 2021-05-31 NOTE — ED Triage Notes (Signed)
No menstrual cycle since November 1.  Home pregnancy tests have read neg.  No cramping.  Last birth control was 2018

## 2021-05-31 NOTE — ED Provider Notes (Signed)
[facility]    CSN: [id number] Arrival date & time: 05/31/21  0907      History   Chief Complaint Chief Complaint  Patient presents with   Appointment    9:00   Possible Pregnancy    HPI Melody Patrick is a 25 y.o. female.   Patient presents with missed period and concerns of pregnancy.  Endorses that October period ended around the 14th then November period was 2 weeks later occurring between 1-5th. Has not had December period. Home pregnancy negative. Not on birth control.  Sexually active.  Denies abdominal pain, cramping, spotting, breast tenderness, nausea, fever, chills.    Past Medical History:  Diagnosis Date   IBS (irritable bowel syndrome)     Patient Active Problem List   Diagnosis Date Noted   On Depo-Provera for contraception 10/09/2016    Past Surgical History:  Procedure Laterality Date   WISDOM TOOTH EXTRACTION      OB History     Gravida  1   Para  1   Term  1   Preterm      AB      Living  1      SAB      IAB      Ectopic      Multiple  0   Live Births  1            Home Medications    Prior to Admission medications   Medication Sig Start Date End Date Taking? Authorizing Provider  omeprazole (PRILOSEC) 20 MG capsule Take 1 capsule (20 mg total) by mouth daily. Patient not taking: Reported on 05/31/2021 05/04/21   Hazel Sams, PA-C  triamcinolone cream (KENALOG) 0.1 % Apply 1 application topically 2 (two) times daily. 05/11/21   Apolonio Schneiders, FNP  cetirizine (ZYRTEC) 10 MG tablet Take 1 tablet (10 mg total) by mouth 2 (two) times daily. Patient not taking: Reported on 01/23/2020 11/12/19 01/25/20  Raylene Everts, MD    Family History Family History  Problem Relation Age of Onset   Hypertension Maternal Grandmother    Healthy Mother     Social History Social History   Tobacco Use   Smoking status: Former    Types: Cigarettes   Smokeless tobacco: Never  Vaping Use   Vaping Use: Some  days  Substance Use Topics   Alcohol use: Yes    Comment: occasionally   Drug use: Not Currently    Types: Marijuana     Allergies   Patient has no known allergies.   Review of Systems Review of Systems  Constitutional: Negative.   Respiratory: Negative.    Cardiovascular: Negative.   Genitourinary:  Positive for menstrual problem. Negative for decreased urine volume, difficulty urinating, dyspareunia, dysuria, enuresis, flank pain, frequency, genital sores, hematuria, pelvic pain, urgency, vaginal bleeding, vaginal discharge and vaginal pain.  Skin: Negative.     Physical Exam Triage Vital Signs ED Triage Vitals  Enc Vitals Group     BP 05/31/21 0929 126/79     Pulse Rate 05/31/21 0929 81     Resp 05/31/21 0929 16     Temp 05/31/21 0929 98.1 F (36.7 C)     Temp Source 05/31/21 0929 Oral     SpO2 05/31/21 0929 100 %     Weight --      Height --      Head Circumference --      Peak Flow --  Pain Score 05/31/21 0927 0     Pain Loc --      Pain Edu? --      Excl. in [place]? --    No data found.  Updated Vital Signs BP 126/79 (BP Location: Left Arm)    Pulse 81    Temp 98.1 F (36.7 C) (Oral)    Resp 16    LMP 04/17/2021    SpO2 100%   Visual Acuity Right Eye Distance:   Left Eye Distance:   Bilateral Distance:    Right Eye Near:   Left Eye Near:    Bilateral Near:     Physical Exam Constitutional:      Appearance: Normal appearance. She is normal weight.  Eyes:     Extraocular Movements: Extraocular movements intact.  Pulmonary:     Effort: Pulmonary effort is normal.  Abdominal:     General: Abdomen is flat.     Palpations: Abdomen is soft.  Skin:    General: Skin is warm and dry.  Neurological:     Mental Status: She is alert and oriented to person, place, and time. Mental status is at baseline.  Psychiatric:        Mood and Affect: Mood normal.        Behavior: Behavior normal.     UC Treatments / Results  Labs (all labs ordered are  listed, but only abnormal results are displayed) Labs Reviewed  POC URINE PREG, ED    EKG   Radiology No results found.  Procedures Procedures (including critical care time)  Medications Ordered in UC Medications - No data to display  Initial Impression / Assessment and Plan / UC Course  I have reviewed the triage vital signs and the nursing notes.  Pertinent labs & imaging results that were available during my care of the patient were reviewed by me and considered in my medical decision making (see chart for details).  Missed menses  Symptomology most likely related to your dysregulated hormones due to last 2 periods occurring within the 2-week timeframe, discussed with patient, recommended watchful waiting, patient given precautions that if pregnancy unwanted that condoms need to be used due to inability to determine ovulation, recommended that if no period occurs by the end of December to follow-up with primary care doctor, given walking referral to gynecology if needed, urine pregnancy today negative, hCG level pending  Final Clinical Impressions(s) / UC Diagnoses   Final diagnoses:  None   Discharge Instructions   None    ED Prescriptions   None    PDMP not reviewed this encounter.   Valinda Hoar, NP 05/31/21 1015

## 2021-05-31 NOTE — Discharge Instructions (Signed)
I think you symptoms are being caused because your hormones are no longer regulated due to the periods in October and November being close   We will contact you if your hormone levels elevated indicating pregnancy, your urine pregnancy test today was negative  If you continue to not have a period in May follow-up with your primary doctor or with a gynecologist, information is listed below  When your period is not regular it is at higher risk that you may become pregnant since you are unable to determine when you are ovulating, please keep this in mind

## 2021-05-31 NOTE — ED Notes (Signed)
Specimen in lab 

## 2021-06-27 ENCOUNTER — Ambulatory Visit (HOSPITAL_COMMUNITY): Payer: Medicaid Other

## 2021-06-29 ENCOUNTER — Ambulatory Visit (HOSPITAL_COMMUNITY)
Admission: RE | Admit: 2021-06-29 | Discharge: 2021-06-29 | Disposition: A | Discharge status: Home / Self Care | Payer: Medicaid Other | Source: Ambulatory Visit | Attending: Student | Admitting: Student

## 2021-06-29 ENCOUNTER — Encounter (HOSPITAL_COMMUNITY): Payer: Self-pay

## 2021-06-29 ENCOUNTER — Other Ambulatory Visit: Payer: Self-pay

## 2021-06-29 VITALS — BP 124/83 | HR 73 | Temp 98.8°F | Resp 18

## 2021-06-29 DIAGNOSIS — Z3202 Encounter for pregnancy test, result negative: Secondary | ICD-10-CM | POA: Diagnosis not present

## 2021-06-29 DIAGNOSIS — N912 Amenorrhea, unspecified: Secondary | ICD-10-CM | POA: Diagnosis not present

## 2021-06-29 LAB — POCT URINALYSIS DIPSTICK, ED / UC
Bilirubin Urine: NEGATIVE
Glucose, UA: NEGATIVE mg/dL
Ketones, ur: NEGATIVE mg/dL
Leukocytes,Ua: NEGATIVE
Nitrite: NEGATIVE
Protein, ur: NEGATIVE mg/dL
Specific Gravity, Urine: >=1.03 (ref 1.005–1.030)
Urobilinogen, UA: 0.2 mg/dL (ref 0.0–1.0)
pH: 5.5 (ref 5.0–8.0)

## 2021-06-29 LAB — POC URINE PREG, ED: Preg Test, Ur: NEGATIVE

## 2021-06-29 NOTE — ED Triage Notes (Signed)
Pt reports last menses was NOV.

## 2021-06-29 NOTE — Discharge Instructions (Addendum)
-  Gynecology should reach out to set up an appointment -Follow-up sooner if new symptoms- abd pain, vaginal discharge, etc.

## 2021-06-29 NOTE — ED Provider Notes (Signed)
[facility]    CSN: [id number] Arrival date & time: 06/29/21  1357      History   Chief Complaint Chief Complaint  Patient presents with   Amenorrhea    HPI Melody Patrick is a 26 y.o. female presenting with amenorrhea for 2 months.  Medical history irregular periods.  States her last menstrual period was 04/17/2021.  We last saw her for this in December, at that time recommended GYN follow-up, she has not been seen by them yet due to insurance issue. States she still hasn't had a period. Denies STI risk. Denies hematuria, dysuria, frequency, urgency, back pain, n/v/d/abd pain, fevers/chills, abdnormal vaginal discharge.   HPI  Past Medical History:  Diagnosis Date   IBS (irritable bowel syndrome)     Patient Active Problem List   Diagnosis Date Noted   On Depo-Provera for contraception 10/09/2016    Past Surgical History:  Procedure Laterality Date   WISDOM TOOTH EXTRACTION      OB History     Gravida  1   Para  1   Term  1   Preterm      AB      Living  1      SAB      IAB      Ectopic      Multiple  0   Live Births  1            Home Medications    Prior to Admission medications   Medication Sig Start Date End Date Taking? Authorizing Provider  omeprazole (PRILOSEC) 20 MG capsule Take 1 capsule (20 mg total) by mouth daily. Patient not taking: Reported on 05/31/2021 05/04/21   Hazel Sams, PA-C  triamcinolone cream (KENALOG) 0.1 % Apply 1 application topically 2 (two) times daily. 05/11/21   Apolonio Schneiders, FNP  cetirizine (ZYRTEC) 10 MG tablet Take 1 tablet (10 mg total) by mouth 2 (two) times daily. Patient not taking: Reported on 01/23/2020 11/12/19 01/25/20  Raylene Everts, MD    Family History Family History  Problem Relation Age of Onset   Hypertension Maternal Grandmother    Healthy Mother     Social History Social History   Tobacco Use   Smoking status: Former    Types: Cigarettes   Smokeless  tobacco: Never  Vaping Use   Vaping Use: Some days  Substance Use Topics   Alcohol use: Yes    Comment: occasionally   Drug use: Not Currently    Types: Marijuana     Allergies   Patient has no known allergies.   Review of Systems Review of Systems  Constitutional:  Negative for chills and fever.  HENT:  Negative for sore throat.   Eyes:  Negative for pain and redness.  Respiratory:  Negative for shortness of breath.   Cardiovascular:  Negative for chest pain.  Gastrointestinal:  Negative for abdominal pain, diarrhea, nausea and vomiting.  Genitourinary:  Negative for decreased urine volume, difficulty urinating, dysuria, flank pain, frequency, genital sores, hematuria and urgency.  Musculoskeletal:  Negative for back pain.  Skin:  Negative for rash.    Physical Exam Triage Vital Signs ED Triage Vitals  Enc Vitals Group     BP 06/29/21 1408 124/83     Pulse Rate 06/29/21 1408 73     Resp 06/29/21 1408 18     Temp 06/29/21 1408 98.8 F (37.1 C)     Temp src --  SpO2 06/29/21 1408 98 %     Weight --      Height --      Head Circumference --      Peak Flow --      Pain Score 06/29/21 1406 0     Pain Loc --      Pain Edu? --      Excl. in [place]? --    No data found.  Updated Vital Signs BP 124/83    Pulse 73    Temp 98.8 F (37.1 C)    Resp 18    LMP 04/17/2021 (Exact Date)    SpO2 98%   Visual Acuity Right Eye Distance:   Left Eye Distance:   Bilateral Distance:    Right Eye Near:   Left Eye Near:    Bilateral Near:     Physical Exam Vitals reviewed.  Constitutional:      General: She is not in acute distress.    Appearance: Normal appearance. She is not ill-appearing.  HENT:     Head: Normocephalic and atraumatic.     Mouth/Throat:     Mouth: Mucous membranes are moist.     Comments: Moist mucous membranes Eyes:     Extraocular Movements: Extraocular movements intact.     Pupils: Pupils are equal, round, and reactive to light.   Cardiovascular:     Rate and Rhythm: Normal rate and regular rhythm.     Heart sounds: Normal heart sounds.  Pulmonary:     Effort: Pulmonary effort is normal.     Breath sounds: Normal breath sounds. No wheezing, rhonchi or rales.  Abdominal:     General: Bowel sounds are normal. There is no distension.     Palpations: Abdomen is soft. There is no mass.     Tenderness: There is no abdominal tenderness. There is no right CVA tenderness, left CVA tenderness, guarding or rebound.  Skin:    General: Skin is warm.     Capillary Refill: Capillary refill takes less than 2 seconds.     Comments: Good skin turgor  Neurological:     General: No focal deficit present.     Mental Status: She is alert and oriented to person, place, and time.  Psychiatric:        Mood and Affect: Mood normal.        Behavior: Behavior normal.     UC Treatments / Results  Labs (all labs ordered are listed, but only abnormal results are displayed) Labs Reviewed  POCT URINALYSIS DIPSTICK, ED / UC - Abnormal; Notable for the following components:      Result Value   Hgb urine dipstick TRACE (*)    All other components within normal limits  POC URINE PREG, ED    EKG   Radiology No results found.  Procedures Procedures (including critical care time)  Medications Ordered in UC Medications - No data to display  Initial Impression / Assessment and Plan / UC Course  I have reviewed the triage vital signs and the nursing notes.  Pertinent labs & imaging results that were available during my care of the patient were reviewed by me and considered in my medical decision making (see chart for details).     This patient is a very pleasant 26 y.o. year old female presenting with amenorrhea. LMP 04/17/21. Negative u-preg today. F/u with gyn, referral sent as she is medicaid paitent. She is in agreement. Denies STI risk. ED return precautions discussed. Patient verbalizes understanding  and agreement.   Final  Clinical Impressions(s) / UC Diagnoses   Final diagnoses:  Amenorrhea  Negative pregnancy test     Discharge Instructions      -Gynecology should reach out to set up an appointment -Follow-up sooner if new symptoms- abd pain, vaginal discharge, etc.      ED Prescriptions   None    PDMP not reviewed this encounter.   Hazel Sams, PA-C 06/29/21 1533

## 2021-07-04 ENCOUNTER — Telehealth: Payer: Self-pay

## 2021-07-04 NOTE — Telephone Encounter (Signed)
Patient is scheduled for 07/16/21 with ABC

## 2021-07-04 NOTE — Telephone Encounter (Signed)
[facility] referring for Irregular periods and abnormal bleeding. Called and left voicemail for patient to call back to be scheduled.

## 2021-07-16 ENCOUNTER — Encounter: Payer: Self-pay | Admitting: Obstetrics and Gynecology

## 2021-07-16 ENCOUNTER — Other Ambulatory Visit (HOSPITAL_COMMUNITY)
Admission: RE | Admit: 2021-07-16 | Discharge: 2021-07-16 | Disposition: A | Discharge status: Home / Self Care | Payer: Medicaid Other | Source: Ambulatory Visit | Attending: Obstetrics and Gynecology | Admitting: Obstetrics and Gynecology

## 2021-07-16 ENCOUNTER — Other Ambulatory Visit: Payer: Self-pay

## 2021-07-16 ENCOUNTER — Ambulatory Visit: Payer: Medicaid Other | Admitting: Obstetrics and Gynecology

## 2021-07-16 VITALS — BP 110/80 | Ht 63.0 in | Wt 176.0 lb

## 2021-07-16 DIAGNOSIS — Z124 Encounter for screening for malignant neoplasm of cervix: Secondary | ICD-10-CM | POA: Insufficient documentation

## 2021-07-16 DIAGNOSIS — N912 Amenorrhea, unspecified: Secondary | ICD-10-CM | POA: Diagnosis not present

## 2021-07-16 LAB — POCT URINE PREGNANCY: Preg Test, Ur: NEGATIVE

## 2021-07-16 MED ORDER — MEDROXYPROGESTERONE ACETATE 10 MG PO TABS: 10.0000 mg | ORAL_TABLET | Freq: Every day | ORAL | 0 refills | Status: DC

## 2021-07-16 NOTE — Progress Notes (Signed)
Patient, No Pcp Per (Inactive)   Chief Complaint  Patient presents with   Amenorrhea    Hasnt had a cycle since Nov, before this pt had cycle every month. Neg UPT beginning of Jan 2023    HPI:      [name] is a 26 y.o. G1P1001 whose LMP was No LMP recorded (lmp unknown)., presents today for NP eval of amenorrhea since 11/22. Menses are usually monthly, lasting 5 days, light to mod flow, no BTB, mild dysmen. Neg UPT 1/23, no other labs done. Sx unusual for pt. Was on depo in 2019 but no recent hormones. Has had some recent wt gain. She is sex active, not using BC. No pain/bleeding. Neg STD testing 11/22; hx of chlamydia, trich, BV and yeast on culture in past. No recent pap.   Patient Active Problem List   Diagnosis Date Noted   On Depo-Provera for contraception 10/09/2016    Past Surgical History:  Procedure Laterality Date   WISDOM TOOTH EXTRACTION      Family History  Problem Relation Age of Onset   Healthy Mother    Breast cancer Maternal Aunt        40s/50s   Breast cancer Maternal Aunt        40s/50s   Ovarian cancer Maternal Aunt        40s/50s   Hypertension Maternal Grandmother     Social History   Socioeconomic History   Marital status: Single    Spouse name: Not on file   Number of children: Not on file   Years of education: Not on file   Highest education level: Not on file  Occupational History   Not on file  Tobacco Use   Smoking status: Former    Types: Cigarettes   Smokeless tobacco: Never  Vaping Use   Vaping Use: Some days  Substance and Sexual Activity   Alcohol use: Yes    Comment: occasionally   Drug use: Not Currently    Types: Marijuana   Sexual activity: Yes    Birth control/protection: None  Other Topics Concern   Not on file  Social History Narrative   Not on file   Social Determinants of Health   Financial Resource Strain: Not on file  Food Insecurity: Not on file  Transportation Needs: Not on file   Physical Activity: Not on file  Stress: Not on file  Social Connections: Not on file  Intimate Partner Violence: Not on file    Outpatient Medications Prior to Visit  Medication Sig Dispense Refill   triamcinolone cream (KENALOG) 0.1 % Apply 1 application topically 2 (two) times daily. 30 g 0   omeprazole (PRILOSEC) 20 MG capsule Take 1 capsule (20 mg total) by mouth daily. (Patient not taking: Reported on 05/31/2021) 14 capsule 0   No facility-administered medications prior to visit.      ROS:  Review of Systems  Constitutional:  Negative for fever.  Gastrointestinal:  Negative for blood in stool, constipation, diarrhea, nausea and vomiting.  Genitourinary:  Positive for menstrual problem. Negative for dyspareunia, dysuria, flank pain, frequency, hematuria, urgency, vaginal bleeding, vaginal discharge and vaginal pain.  Musculoskeletal:  Negative for back pain.  Skin:  Negative for rash.  BREAST: No symptoms   OBJECTIVE:   Vitals:  BP 110/80    Ht 5\' 3"  (1.6 m)    Wt 176 lb (79.8 kg)    LMP  (LMP Unknown)    BMI 31.18 kg/m  Physical Exam Vitals reviewed.  Constitutional:      Appearance: She is well-developed.  Pulmonary:     Effort: Pulmonary effort is normal.  Genitourinary:    General: Normal vulva.     Pubic Area: No rash.      Labia:        Right: No rash, tenderness or lesion.        Left: No rash, tenderness or lesion.      Vagina: Normal. No vaginal discharge, erythema or tenderness.     Cervix: Normal.     Uterus: Normal. Not enlarged and not tender.      Adnexa: Right adnexa normal and left adnexa normal.       Right: No mass or tenderness.         Left: No mass or tenderness.    Musculoskeletal:        General: Normal range of motion.     Cervical back: Normal range of motion.  Skin:    General: Skin is warm and dry.  Neurological:     General: No focal deficit present.     Mental Status: She is alert and oriented to person, place, and time.   Psychiatric:        Mood and Affect: Mood normal.        Behavior: Behavior normal.        Thought Content: Thought content normal.        Judgment: Judgment normal.    Results: Results for orders placed or performed in visit on 07/16/21 (from the past 24 hour(s))  POCT urine pregnancy     Status: Normal   Collection Time: 07/16/21  3:37 PM  Result Value Ref Range   Preg Test, Ur Negative Negative     Assessment/Plan: Amenorrhea - Plan: TSH + free T4, Prolactin, Comprehensive metabolic panel, POCT urine pregnancy, medroxyPROGESTERone (PROVERA) 10 MG tablet; neg UPT. Sx for 3 months. Check labs, Rx provera. Will f/u with results; pt to f/u either way after provera use re: bleeding. If has withdrawal bleed, then will see if resets menstrual cycle. If no withdrawal bleed, will eval further.   Cervical cancer screening - Plan: Cytology - PAP    Meds ordered this encounter  Medications   medroxyPROGESTERone (PROVERA) 10 MG tablet    Sig: Take 1 tablet (10 mg total) by mouth daily for 7 days.    Dispense:  7 tablet    Refill:  0    Order Specific Question:   Supervising Provider    Answer:   Gae Dry [id number]      Return if symptoms worsen or fail to improve.  Thomasa Heidler B. Johnney Scarlata, PA-C 07/16/2021 3:38 PM

## 2021-07-16 NOTE — Patient Instructions (Signed)
I value your feedback and you entrusting us with your care. If you get a [facility] patient survey, I would appreciate you taking the time to let us know about your experience today. Thank you! ? ? ?

## 2021-07-17 LAB — TSH+FREE T4
Free T4: 1.31 ng/dL (ref 0.82–1.77)
TSH: 0.876 u[IU]/mL (ref 0.450–4.500)

## 2021-07-17 LAB — COMPREHENSIVE METABOLIC PANEL
ALT: 10 IU/L (ref 0–32)
AST: 15 IU/L (ref 0–40)
Albumin/Globulin Ratio: 1.5 (ref 1.2–2.2)
Albumin: 4 g/dL (ref 3.9–5.0)
Alkaline Phosphatase: 60 IU/L (ref 44–121)
BUN/Creatinine Ratio: 9 (ref 9–23)
BUN: 7 mg/dL (ref 6–20)
Bilirubin Total: 0.2 mg/dL (ref 0.0–1.2)
CO2: 22 mmol/L (ref 20–29)
Calcium: 9.3 mg/dL (ref 8.7–10.2)
Chloride: 104 mmol/L (ref 96–106)
Creatinine, Ser: 0.79 mg/dL (ref 0.57–1.00)
Globulin, Total: 2.6 g/dL (ref 1.5–4.5)
Glucose: 90 mg/dL (ref 70–99)
Potassium: 4.2 mmol/L (ref 3.5–5.2)
Sodium: 138 mmol/L (ref 134–144)
Total Protein: 6.6 g/dL (ref 6.0–8.5)
eGFR: 106 mL/min/{1.73_m2} (ref >59)

## 2021-07-17 LAB — PROLACTIN: Prolactin: 10.7 ng/mL (ref 4.8–23.3)

## 2021-07-19 ENCOUNTER — Encounter: Payer: Self-pay | Admitting: Obstetrics and Gynecology

## 2021-07-19 LAB — CYTOLOGY - PAP
Comment: NEGATIVE
Diagnosis: NEGATIVE
High risk HPV: NEGATIVE

## 2021-08-28 ENCOUNTER — Ambulatory Visit (HOSPITAL_COMMUNITY)
Admission: RE | Admit: 2021-08-28 | Discharge: 2021-08-28 | Disposition: A | Discharge status: Home / Self Care | Payer: Medicaid Other | Source: Ambulatory Visit | Attending: Internal Medicine | Admitting: Internal Medicine

## 2021-08-28 ENCOUNTER — Other Ambulatory Visit: Payer: Self-pay

## 2021-08-28 ENCOUNTER — Ambulatory Visit (HOSPITAL_COMMUNITY): Payer: Medicaid Other

## 2021-08-28 ENCOUNTER — Encounter (HOSPITAL_COMMUNITY): Payer: Self-pay

## 2021-08-28 VITALS — BP 117/77 | HR 76 | Temp 97.8°F | Resp 18

## 2021-08-28 DIAGNOSIS — N926 Irregular menstruation, unspecified: Secondary | ICD-10-CM | POA: Diagnosis not present

## 2021-08-28 DIAGNOSIS — Z113 Encounter for screening for infections with a predominantly sexual mode of transmission: Secondary | ICD-10-CM | POA: Diagnosis not present

## 2021-08-28 LAB — POC URINE PREG, ED: Preg Test, Ur: NEGATIVE

## 2021-08-28 NOTE — ED Provider Notes (Signed)
?[facility] ? ? ? ?CSN: [id number] ?Arrival date & time: 08/28/21  1843 ? ? ?  ? ?History   ?Chief Complaint ?Chief Complaint  ?Patient presents with  ? Possible Pregnancy  ? SEXUALLY TRANSMITTED DISEASE  ? ? ?HPI ?Melody Patrick is a 26 y.o. female.  ? ?Patient presents requesting pregnancy test after missed menses, last known.  07/26/2020.  Patient has been having her irregular periods since December 2022, was being followed by gynecology, vaginal bleeding began on 07/19/2021 after use of medication.  Sexually active.  Requesting STI testing.  Denies all symptoms.  No known exposure. ? ?Past Medical History:  ?Diagnosis Date  ? IBS (irritable bowel syndrome)   ? ? ?Patient Active Problem List  ? Diagnosis Date Noted  ? On Depo-Provera for contraception 10/09/2016  ? ? ?Past Surgical History:  ?Procedure Laterality Date  ? WISDOM TOOTH EXTRACTION    ? ? ?OB History   ? ? Gravida  ?2  ? Para  ?1  ? Term  ?1  ? Preterm  ?   ? AB  ?1  ? Living  ?1  ?  ? ? SAB  ?1  ? IAB  ?   ? Ectopic  ?   ? Multiple  ?0  ? Live Births  ?1  ?   ?  ?  ? ? ? ?Home Medications   ? ?Prior to Admission medications   ?Medication Sig Start Date End Date Taking? Authorizing Provider  ?cetirizine (ZYRTEC) 10 MG tablet Take 1 tablet (10 mg total) by mouth 2 (two) times daily. ?Patient not taking: Reported on 01/23/2020 11/12/19 01/25/20  Eustace Moore, MD  ? ? ?Family History ?Family History  ?Problem Relation Age of Onset  ? Healthy Mother   ? Breast cancer Maternal Aunt   ?     40s/50s  ? Breast cancer Maternal Aunt   ?     40s/50s  ? Ovarian cancer Maternal Aunt   ?     40s/50s  ? Hypertension Maternal Grandmother   ? ? ?Social History ?Social History  ? ?Tobacco Use  ? Smoking status: Former  ?  Types: Cigarettes  ? Smokeless tobacco: Never  ?Vaping Use  ? Vaping Use: Some days  ?Substance Use Topics  ? Alcohol use: Yes  ?  Comment: occasionally  ? Drug use: Not Currently  ?  Types: Marijuana  ? ? ? ?Allergies   ?Patient has no  known allergies. ? ? ?Review of Systems ?Review of Systems  ?Constitutional: Negative.   ?Respiratory: Negative.    ?Cardiovascular: Negative.   ?Genitourinary:  Positive for menstrual problem. Negative for decreased urine volume, difficulty urinating, dyspareunia, dysuria, enuresis, flank pain, frequency, genital sores, hematuria, pelvic pain, urgency, vaginal bleeding, vaginal discharge and vaginal pain.  ?Skin: Negative.   ? ? ?Physical Exam ?Triage Vital Signs ?ED Triage Vitals [08/28/21 1925]  ?Enc Vitals Group  ?   BP 117/77  ?   Pulse Rate 76  ?   Resp 18  ?   Temp 97.8 ?F (36.6 ?C)  ?   Temp Source Oral  ?   SpO2 100 %  ?   Weight   ?   Height   ?   Head Circumference   ?   Peak Flow   ?   Pain Score 0  ?   Pain Loc   ?   Pain Edu?   ?   Excl. in GC?   ? ?  No data found. ? ?Updated Vital Signs ?BP 117/77 (BP Location: Left Arm)   Pulse 76   Temp 97.8 ?F (36.6 ?C) (Oral)   Resp 18   LMP 07/26/2021   SpO2 100%  ? ?Visual Acuity ?Right Eye Distance:   ?Left Eye Distance:   ?Bilateral Distance:   ? ?Right Eye Near:   ?Left Eye Near:    ?Bilateral Near:    ? ?Physical Exam ?Constitutional:   ?   Appearance: Normal appearance.  ?Eyes:  ?   Extraocular Movements: Extraocular movements intact.  ?Pulmonary:  ?   Effort: Pulmonary effort is normal.  ?Genitourinary: ?   Comments: Deferred, self collect vaginal swab ?Skin: ?   General: Skin is warm and dry.  ?Neurological:  ?   Mental Status: She is alert and oriented to person, place, and time. Mental status is at baseline.  ?Psychiatric:     ?   Mood and Affect: Mood normal.     ?   Behavior: Behavior normal.  ? ? ? ?UC Treatments / Results  ?Labs ?(all labs ordered are listed, but only abnormal results are displayed) ?Labs Reviewed  ?POC URINE PREG, ED  ?CERVICOVAGINAL ANCILLARY ONLY  ? ? ?EKG ? ? ?Radiology ?No results found. ? ?Procedures ?Procedures (including critical care time) ? ?Medications Ordered in UC ?Medications - No data to display ? ?Initial  Impression / Assessment and Plan / UC Course  ?I have reviewed the triage vital signs and the nursing notes. ? ?Pertinent labs & imaging results that were available during my care of the patient were reviewed by me and considered in my medical decision making (see chart for details). ? ?Missed menses ?Routine screening for STI ? ?Urine pregnancy negative, recommended patient follow-up with her gynecologist for reevaluation of irregularity of menses, STI labs pending, advised abstinence until all lab results and/or treatment is complete, advised condom use during all sexual encounters moving forward, urgent care follow-up as needed ?Final Clinical Impressions(s) / UC Diagnoses  ? ?Final diagnoses:  ?Missed menses  ?Routine screening for STI (sexually transmitted infection)  ? ? ? ?Discharge Instructions   ? ?  ?Urine pregnancy test negative, please follow-up with Dr. Dallas Schimke for further management of your irregular cycle ? ?Labs pending 2-3 days, you will be contacted if positive for any sti and treatment will be sent to the pharmacy, you will have to return to the clinic if positive for gonorrhea to receive treatment  ? ?Please refrain from having sex until labs results, if positive please refrain from having sex until treatment complete and symptoms resolve  ? ?If positive for , Chlamydia  gonorrhea or trichomoniasis please notify partner or partners so they may tested as well ? ?Moving forward, it is recommended you use some form of protection against the transmission of sti infections  such as condoms or dental dams with each sexual encounter   ? ? ?ED Prescriptions   ?None ?  ? ?PDMP not reviewed this encounter. ?  ?Valinda Hoar, NP ?08/28/21 1954 ? ?

## 2021-08-28 NOTE — ED Triage Notes (Signed)
Pt requesting pregnancy testing and STD testing. States last cycle was 2/9. Denies vaginal discharge. ?

## 2021-08-28 NOTE — Discharge Instructions (Signed)
Urine pregnancy test negative, please follow-up with Dr. Dallas Schimke for further management of your irregular cycle ? ?Labs pending 2-3 days, you will be contacted if positive for any sti and treatment will be sent to the pharmacy, you will have to return to the clinic if positive for gonorrhea to receive treatment  ? ?Please refrain from having sex until labs results, if positive please refrain from having sex until treatment complete and symptoms resolve  ? ?If positive for , Chlamydia  gonorrhea or trichomoniasis please notify partner or partners so they may tested as well ? ?Moving forward, it is recommended you use some form of protection against the transmission of sti infections  such as condoms or dental dams with each sexual encounter   ?

## 2021-08-29 ENCOUNTER — Telehealth: Payer: Self-pay | Admitting: Obstetrics and Gynecology

## 2021-08-29 DIAGNOSIS — N912 Amenorrhea, unspecified: Secondary | ICD-10-CM

## 2021-08-29 LAB — CERVICOVAGINAL ANCILLARY ONLY
Bacterial Vaginitis (gardnerella): POSITIVE — AB
Candida Glabrata: NEGATIVE
Candida Vaginitis: NEGATIVE
Chlamydia: NEGATIVE
Comment: NEGATIVE
Comment: NEGATIVE
Comment: NEGATIVE
Comment: NEGATIVE
Comment: NEGATIVE
Comment: NORMAL
Neisseria Gonorrhea: NEGATIVE
Trichomonas: NEGATIVE

## 2021-08-29 NOTE — Telephone Encounter (Signed)
Lab orders for amenorrhea. Had withdrawal bleed with provera ?

## 2021-08-30 ENCOUNTER — Other Ambulatory Visit: Payer: Self-pay

## 2021-08-30 ENCOUNTER — Other Ambulatory Visit: Payer: Medicaid Other

## 2021-08-30 ENCOUNTER — Telehealth (HOSPITAL_COMMUNITY): Payer: Self-pay | Admitting: Emergency Medicine

## 2021-08-30 DIAGNOSIS — N912 Amenorrhea, unspecified: Secondary | ICD-10-CM | POA: Diagnosis not present

## 2021-08-30 MED ORDER — METRONIDAZOLE 500 MG PO TABS: 500.0000 mg | ORAL_TABLET | Freq: Two times a day (BID) | ORAL | 0 refills | Status: DC

## 2021-09-03 ENCOUNTER — Telehealth: Payer: Self-pay

## 2021-09-03 NOTE — Telephone Encounter (Signed)
Patient is calling for labs results. Please advise. 

## 2021-09-03 NOTE — Telephone Encounter (Signed)
Called pt back, no answer. Could not leave voice msg due to mailbox full. ?

## 2021-09-03 NOTE — Telephone Encounter (Signed)
Pt aware.

## 2021-09-03 NOTE — Telephone Encounter (Signed)
They aren't back yet. The testosterone takes time to come back. She'll see results through MyChart and I"ll f/u with her once I get them. ?

## 2021-09-04 LAB — DHEA-SULFATE: DHEA-SO4: 179 ug/dL (ref 84.8–378.0)

## 2021-09-04 LAB — FSH/LH
FSH: 4.4 m[IU]/mL
LH: 24.5 m[IU]/mL

## 2021-09-04 LAB — TESTOSTERONE,FREE AND TOTAL
Testosterone, Free: 3 pg/mL (ref 0.0–4.2)
Testosterone: 69 ng/dL (ref 13–71)

## 2021-09-04 LAB — ESTRADIOL: Estradiol: 47.2 pg/mL

## 2021-09-04 LAB — ANDROSTENEDIONE: Androstenedione LCMS: 290 ng/dL — ABNORMAL HIGH (ref 41–262)

## 2021-09-04 LAB — PROGESTERONE: Progesterone: 0.6 ng/mL

## 2021-09-05 ENCOUNTER — Telehealth: Payer: Self-pay | Admitting: Obstetrics and Gynecology

## 2021-09-05 MED ORDER — MEDROXYPROGESTERONE ACETATE 10 MG PO TABS: 10.0000 mg | ORAL_TABLET | Freq: Every day | ORAL | 0 refills | Status: DC

## 2021-09-05 NOTE — Telephone Encounter (Signed)
Pt aware of labs for oligo/amenorrhea, suggestive of PCOS. Pt had withdrawal bleed with provera. Discussed BC vs provera Q90 days. Pt would like provera. Will take Q90 days if no menses, AFTER neg first AM UPT. Pt understands. Rx eRxd. Also discussed wt loss may improve cycle frequency. Pt had gained some wt before sx started. F/u prn.  ? ?Meds ordered this encounter  ?Medications  ? medroxyPROGESTERone (PROVERA) 10 MG tablet  ?  Sig: Take 1 tablet (10 mg total) by mouth daily for 7 days. Q90 days if no spontaneous menses  ?  Dispense:  21 tablet  ?  Refill:  0  ? ? ?

## 2021-09-14 ENCOUNTER — Ambulatory Visit: Payer: Medicaid Other | Admitting: Nurse Practitioner

## 2021-09-26 ENCOUNTER — Encounter (HOSPITAL_COMMUNITY): Payer: Self-pay

## 2021-09-26 ENCOUNTER — Ambulatory Visit (HOSPITAL_COMMUNITY)
Admission: RE | Admit: 2021-09-26 | Discharge: 2021-09-26 | Disposition: A | Discharge status: Home / Self Care | Payer: Medicaid Other | Source: Ambulatory Visit | Attending: Physician Assistant | Admitting: Physician Assistant

## 2021-09-26 VITALS — BP 121/84 | HR 78 | Temp 97.8°F | Resp 17 | Ht 63.0 in | Wt 175.9 lb

## 2021-09-26 DIAGNOSIS — N898 Other specified noninflammatory disorders of vagina: Secondary | ICD-10-CM | POA: Insufficient documentation

## 2021-09-26 DIAGNOSIS — Z113 Encounter for screening for infections with a predominantly sexual mode of transmission: Secondary | ICD-10-CM | POA: Diagnosis not present

## 2021-09-26 LAB — POC URINE PREG, ED: Preg Test, Ur: NEGATIVE

## 2021-09-26 LAB — HEPATITIS C ANTIBODY: HCV Ab: NONREACTIVE

## 2021-09-26 LAB — HIV ANTIBODY (ROUTINE TESTING W REFLEX): HIV Screen 4th Generation wRfx: NONREACTIVE

## 2021-09-26 NOTE — ED Triage Notes (Signed)
Pt reports "a little vaginal itching" x 3 days. Requesting STD testing including blood work.  ?

## 2021-09-26 NOTE — ED Provider Notes (Signed)
?[facility] ? ? ? ?CSN: [id number] ?Arrival date & time: 09/26/21  1307 ? ? ?  ? ?History   ?Chief Complaint ?Chief Complaint  ?Patient presents with  ? SEXUALLY TRANSMITTED DISEASE  ?  Want to get tested for everything - Entered by patient  ? ? ?HPI ?Melody Patrick is a 26 y.o. female.  ? ?Patient presents today with a 3-day history of vaginal irritation which she describes as internal pruritus.  She denies any odor, discharge, pelvic pain, abdominal pain, fever, nausea, vomiting.  She is interested in STI panel.  She does report changing her soap recently and wonders if this could be contributing to symptoms.  She also reports using a new brand of condom and wonders if this could be contributing to symptoms.  She denies any recent antibiotic use.  Denies any recent medication changes.  She does have a history of bacterial vaginosis and states symptoms are similar but not identical to previous episodes of this condition.  She has not tried any over-the-counter medication for symptom management. ? ? ?Past Medical History:  ?Diagnosis Date  ? IBS (irritable bowel syndrome)   ? ? ?Patient Active Problem List  ? Diagnosis Date Noted  ? On Depo-Provera for contraception 10/09/2016  ? ? ?Past Surgical History:  ?Procedure Laterality Date  ? WISDOM TOOTH EXTRACTION    ? ? ?OB History   ? ? Gravida  ?2  ? Para  ?1  ? Term  ?1  ? Preterm  ?   ? AB  ?1  ? Living  ?1  ?  ? ? SAB  ?1  ? IAB  ?   ? Ectopic  ?   ? Multiple  ?0  ? Live Births  ?1  ?   ?  ?  ? ? ? ?Home Medications   ? ?Prior to Admission medications   ?Medication Sig Start Date End Date Taking? Authorizing Provider  ?medroxyPROGESTERone (PROVERA) 10 MG tablet Take 1 tablet (10 mg total) by mouth daily for 7 days. Q90 days if no spontaneous menses 09/05/21 09/12/21  Copland, Ilona Sorrel, PA-C  ?metroNIDAZOLE (FLAGYL) 500 MG tablet Take 1 tablet (500 mg total) by mouth 2 (two) times daily. 08/30/21   LampteyBritta Mccreedy, MD  ?cetirizine (ZYRTEC) 10 MG tablet  Take 1 tablet (10 mg total) by mouth 2 (two) times daily. ?Patient not taking: Reported on 01/23/2020 11/12/19 01/25/20  Eustace Moore, MD  ? ? ?Family History ?Family History  ?Problem Relation Age of Onset  ? Healthy Mother   ? Breast cancer Maternal Aunt   ?     40s/50s  ? Breast cancer Maternal Aunt   ?     40s/50s  ? Ovarian cancer Maternal Aunt   ?     40s/50s  ? Hypertension Maternal Grandmother   ? ? ?Social History ?Social History  ? ?Tobacco Use  ? Smoking status: Former  ?  Types: Cigarettes  ? Smokeless tobacco: Never  ?Vaping Use  ? Vaping Use: Some days  ?Substance Use Topics  ? Alcohol use: Yes  ?  Comment: occasionally  ? Drug use: Not Currently  ?  Types: Marijuana  ? ? ? ?Allergies   ?Patient has no known allergies. ? ? ?Review of Systems ?Review of Systems  ?Constitutional:  Negative for activity change, appetite change, fatigue and fever.  ?Gastrointestinal:  Negative for abdominal pain, diarrhea, nausea and vomiting.  ?Genitourinary:  Positive for vaginal pain (Irritation). Negative for  dysuria, frequency, pelvic pain, vaginal bleeding and vaginal discharge.  ? ? ?Physical Exam ?Triage Vital Signs ?ED Triage Vitals  ?Enc Vitals Group  ?   BP 09/26/21 1327 121/84  ?   Pulse Rate 09/26/21 1327 78  ?   Resp 09/26/21 1327 17  ?   Temp 09/26/21 1327 97.8 ?F (36.6 ?C)  ?   Temp Source 09/26/21 1327 Oral  ?   SpO2 09/26/21 1327 100 %  ?   Weight 09/26/21 1326 175 lb 14.8 oz (79.8 kg)  ?   Height 09/26/21 1326 5\' 3"  (1.6 m)  ?   Head Circumference --   ?   Peak Flow --   ?   Pain Score 09/26/21 1326 0  ?   Pain Loc --   ?   Pain Edu? --   ?   Excl. in GC? --   ? ?No data found. ? ?Updated Vital Signs ?BP 121/84 (BP Location: Left Arm)   Pulse 78   Temp 97.8 ?F (36.6 ?C) (Oral)   Resp 17   Ht 5\' 3"  (1.6 m)   Wt 175 lb 14.8 oz (79.8 kg)   SpO2 100%   BMI 31.16 kg/m?  ? ?Visual Acuity ?Right Eye Distance:   ?Left Eye Distance:   ?Bilateral Distance:   ? ?Right Eye Near:   ?Left Eye Near:     ?Bilateral Near:    ? ?Physical Exam ?Vitals reviewed.  ?Constitutional:   ?   General: She is awake. She is not in acute distress. ?   Appearance: Normal appearance. She is well-developed. She is not ill-appearing.  ?   Comments: Very pleasant female appears stated age in no acute distress sitting comfortably in exam room  ?HENT:  ?   Head: Normocephalic and atraumatic.  ?Cardiovascular:  ?   Rate and Rhythm: Normal rate and regular rhythm.  ?   Heart sounds: Normal heart sounds, S1 normal and S2 normal. No murmur heard. ?Pulmonary:  ?   Effort: Pulmonary effort is normal.  ?   Breath sounds: Normal breath sounds. No wheezing, rhonchi or rales.  ?   Comments: Clear to auscultation bilaterally ?Abdominal:  ?   General: Bowel sounds are normal.  ?   Palpations: Abdomen is soft.  ?   Tenderness: There is no abdominal tenderness. There is no right CVA tenderness, left CVA tenderness, guarding or rebound.  ?   Comments: Benign abdominal exam  ?Genitourinary: ?   Comments: Exam deferred ?Psychiatric:     ?   Behavior: Behavior is cooperative.  ? ? ? ?UC Treatments / Results  ?Labs ?(all labs ordered are listed, but only abnormal results are displayed) ?Labs Reviewed  ?HIV ANTIBODY (ROUTINE TESTING W REFLEX)  ?RPR  ?HEPATITIS C ANTIBODY  ?POC URINE PREG, ED  ?CERVICOVAGINAL ANCILLARY ONLY  ? ? ?EKG ? ? ?Radiology ?No results found. ? ?Procedures ?Procedures (including critical care time) ? ?Medications Ordered in UC ?Medications - No data to display ? ?Initial Impression / Assessment and Plan / UC Course  ?I have reviewed the triage vital signs and the nursing notes. ? ?Pertinent labs & imaging results that were available during my care of the patient were reviewed by me and considered in my medical decision making (see chart for details). ? ?  ? ?Urine pregnancy negative today.  STI testing obtained today-results pending.  Discussed with and utility of empirically treating for yeast given vaginal irritation but patient  declined this.  She  will wait for swab results to be available before starting treatment.  Recommended she use hypoallergenic soaps and detergents and wear loosefitting cotton underwear.  Also recommended that she monitor to see if she has recurrent symptoms after condom use in case she is sensitive to one of the chemicals in this new brand of condoms.  She is to abstain from sexual activity until results are available.  Discussed that if she develops any worsening symptoms including abdominal pain, pelvic pain, fever, nausea, vomiting she needs to be seen immediately.  Strict return precautions given to which she expressed understanding. ? ?Final Clinical Impressions(s) / UC Diagnoses  ? ?Final diagnoses:  ?Vaginal irritation  ?Routine screening for STI (sexually transmitted infection)  ? ? ? ?Discharge Instructions   ? ?  ?Monitor your MyChart for your results.  We will contact you if anything is positive and we need to arrange treatment.  Wear loosefitting cotton underwear and use hypoallergenic soaps and detergents.  You should abstain from sex until you receive your results.  If you are positive all partners will need to be tested and treated as well.  Use a condom with each sexual encounter.  If you develop any worsening symptoms including abdominal pain, fever, nausea, vomiting, pelvic pain you should be seen immediately. ? ? ? ? ?ED Prescriptions   ?None ?  ? ?PDMP not reviewed this encounter. ?  ?Jeani Hawking, PA-C ?09/26/21 1351 ? ?

## 2021-09-26 NOTE — Discharge Instructions (Addendum)
Monitor your MyChart for your results.  We will contact you if anything is positive and we need to arrange treatment.  Wear loosefitting cotton underwear and use hypoallergenic soaps and detergents.  You should abstain from sex until you receive your results.  If you are positive all partners will need to be tested and treated as well.  Use a condom with each sexual encounter.  If you develop any worsening symptoms including abdominal pain, fever, nausea, vomiting, pelvic pain you should be seen immediately. ?

## 2021-09-27 ENCOUNTER — Telehealth (HOSPITAL_COMMUNITY): Payer: Self-pay | Admitting: Emergency Medicine

## 2021-09-27 LAB — RPR: RPR Ser Ql: NONREACTIVE

## 2021-09-27 LAB — CERVICOVAGINAL ANCILLARY ONLY
Bacterial Vaginitis (gardnerella): POSITIVE — AB
Candida Glabrata: NEGATIVE
Candida Vaginitis: NEGATIVE
Chlamydia: NEGATIVE
Comment: NEGATIVE
Comment: NEGATIVE
Comment: NEGATIVE
Comment: NEGATIVE
Comment: NEGATIVE
Comment: NORMAL
Neisseria Gonorrhea: NEGATIVE
Trichomonas: NEGATIVE

## 2021-09-27 MED ORDER — METRONIDAZOLE 0.75 % VA GEL: 1.0000 | Freq: Every day | VAGINAL | 0 refills | Status: AC

## 2021-10-18 ENCOUNTER — Encounter: Payer: Self-pay | Admitting: Obstetrics and Gynecology

## 2021-10-18 ENCOUNTER — Other Ambulatory Visit: Payer: Self-pay | Admitting: Obstetrics and Gynecology

## 2021-10-18 MED ORDER — MICROGESTIN 24 FE 1-20 MG-MCG PO TABS: 1.0000 | ORAL_TABLET | Freq: Every day | ORAL | 2 refills | Status: DC

## 2021-10-18 MED ORDER — MEDROXYPROGESTERONE ACETATE 10 MG PO TABS: 10.0000 mg | ORAL_TABLET | Freq: Every day | ORAL | 0 refills | Status: DC

## 2021-10-18 NOTE — Progress Notes (Signed)
Rx prover since no menses in 3 months, then start OCPs. Rx lomedia eRxd.  ?

## 2021-12-07 ENCOUNTER — Encounter: Payer: Self-pay | Admitting: Obstetrics and Gynecology

## 2021-12-31 ENCOUNTER — Ambulatory Visit (HOSPITAL_COMMUNITY): Payer: Medicaid Other

## 2022-01-01 ENCOUNTER — Encounter (HOSPITAL_COMMUNITY): Payer: Self-pay

## 2022-01-01 ENCOUNTER — Ambulatory Visit (HOSPITAL_COMMUNITY)
Admission: RE | Admit: 2022-01-01 | Discharge: 2022-01-01 | Disposition: A | Discharge status: Home / Self Care | Payer: Medicaid Other | Source: Ambulatory Visit | Attending: Emergency Medicine | Admitting: Emergency Medicine

## 2022-01-01 VITALS — BP 123/80 | HR 86 | Temp 99.3°F | Resp 18

## 2022-01-01 DIAGNOSIS — L0231 Cutaneous abscess of buttock: Secondary | ICD-10-CM

## 2022-01-01 DIAGNOSIS — Z113 Encounter for screening for infections with a predominantly sexual mode of transmission: Secondary | ICD-10-CM | POA: Diagnosis not present

## 2022-01-01 MED ORDER — DOXYCYCLINE HYCLATE 100 MG PO CAPS: 100.0000 mg | ORAL_CAPSULE | Freq: Two times a day (BID) | ORAL | 0 refills | Status: DC

## 2022-01-01 NOTE — Discharge Instructions (Addendum)
Take doxycycline every morning and every evening for 7 days  Hold warm-hot compresses to affected area at least 4 times a day, this helps to facilitate draining, the more the better  Please return for evaluation for increased swelling, increased tenderness or pain, non healing site, non draining site, you begin to have fever or chills   We reviewed the etiology of recurrent abscesses of skin.  Skin abscesses are collections of pus within the dermis and deeper skin tissues. Skin abscesses manifest as painful, tender, fluctuant, and erythematous nodules, frequently surmounted by a pustule and surrounded by a rim of erythematous swelling.  Spontaneous drainage of purulent material may occur.  Fever can occur on occasion.    -Skin abscesses can develop in healthy individuals with no predisposing conditions other than skin or nasal carriage of Staphylococcus aureus.  Individuals in close contact with others who have active infection with skin abscesses are at increased risk which is likely to explain why twin brother has similar episodes.   In addition, any process leading to a breach in the skin barrier can also predispose to the development of a skin abscesses, such as atopic dermatitis.     Labs pending 2-3 days, you will be contacted if positive for any sti and treatment will be sent to the pharmacy, you will have to return to the clinic if positive for gonorrhea to receive treatment   Please refrain from having sex until labs results, if positive please refrain from having sex until treatment complete and symptoms resolve   If positive for  Chlamydia  gonorrhea or trichomoniasis please notify partner or partners so they may tested as well  Moving forward, it is recommended you use some form of protection against the transmission of sti infections  such as condoms or dental dams with each sexual encounter

## 2022-01-01 NOTE — ED Triage Notes (Signed)
Pt reports a possible cyst on the right buttock. States she noticed it a week ago. Denies any pain/tenderness and drainage.

## 2022-01-01 NOTE — ED Provider Notes (Signed)
[facility]    CSN: [id number] Arrival date & time: 01/01/22  1847      History   Chief Complaint Chief Complaint  Patient presents with   Cyst    HPI Melody Patrick is a 26 y.o. female.   Patient presents with cyst to the right buttocks beginning 7 days ago.  Site is nontender, nondraining and she endorses it is a deep underneath the skin.  Has not attempted treatment of symptoms.  Denies fevers or chills  Requesting routine STD testing.  Sexually active, 1 female partner, no condom use, no known exposure.  Denies all symptoms.   Past Medical History:  Diagnosis Date   IBS (irritable bowel syndrome)     Patient Active Problem List   Diagnosis Date Noted   On Depo-Provera for contraception 10/09/2016    Past Surgical History:  Procedure Laterality Date   WISDOM TOOTH EXTRACTION      OB History     Gravida  2   Para  1   Term  1   Preterm      AB  1   Living  1      SAB  1   IAB      Ectopic      Multiple  0   Live Births  1            Home Medications    Prior to Admission medications   Medication Sig Start Date End Date Taking? Authorizing Provider  doxycycline (VIBRAMYCIN) 100 MG capsule Take 1 capsule (100 mg total) by mouth 2 (two) times daily. 01/01/22  Yes Marque Rademaker, Elita Boone, NP  medroxyPROGESTERone (PROVERA) 10 MG tablet Take 1 tablet (10 mg total) by mouth daily for 7 days. 10/18/21 10/25/21  Copland, Ilona Sorrel, PA-C  Norethindrone Acetate-Ethinyl Estrad-FE (MICROGESTIN 24 FE) 1-20 MG-MCG(24) tablet Take 1 tablet by mouth daily. 10/18/21   Copland, Ilona Sorrel, PA-C  cetirizine (ZYRTEC) 10 MG tablet Take 1 tablet (10 mg total) by mouth 2 (two) times daily. Patient not taking: Reported on 01/23/2020 11/12/19 01/25/20  Eustace Moore, MD    Family History Family History  Problem Relation Age of Onset   Healthy Mother    Breast cancer Maternal Aunt        40s/50s   Breast cancer Maternal Aunt        40s/50s   Ovarian  cancer Maternal Aunt        40s/50s   Hypertension Maternal Grandmother     Social History Social History   Tobacco Use   Smoking status: Former    Types: Cigarettes   Smokeless tobacco: Never  Vaping Use   Vaping Use: Some days  Substance Use Topics   Alcohol use: Yes    Comment: occasionally   Drug use: Not Currently    Types: Marijuana     Allergies   Patient has no known allergies.   Review of Systems Review of Systems  Constitutional: Negative.   HENT: Negative.    Respiratory: Negative.    Cardiovascular: Negative.   Genitourinary: Negative.   Skin:  Positive for wound. Negative for color change, pallor and rash.     Physical Exam Triage Vital Signs ED Triage Vitals  Enc Vitals Group     BP 01/01/22 1900 123/80     Pulse Rate 01/01/22 1900 86     Resp 01/01/22 1900 18     Temp 01/01/22 1900 99.3 F (37.4 C)  Temp Source 01/01/22 1900 Oral     SpO2 01/01/22 1900 97 %     Weight --      Height --      Head Circumference --      Peak Flow --      Pain Score 01/01/22 1859 0     Pain Loc --      Pain Edu? --      Excl. in [place]? --    No data found.  Updated Vital Signs BP 123/80 (BP Location: Right Arm)   Pulse 86   Temp 99.3 F (37.4 C) (Oral)   Resp 18   SpO2 97%   Visual Acuity Right Eye Distance:   Left Eye Distance:   Bilateral Distance:    Right Eye Near:   Left Eye Near:    Bilateral Near:     Physical Exam Constitutional:      Appearance: Normal appearance.  HENT:     Head: Normocephalic.  Eyes:     Extraocular Movements: Extraocular movements intact.  Pulmonary:     Effort: Pulmonary effort is normal.  Genitourinary:    Comments: deferred Skin:    Comments: 1 x 1 cm immature abscess noted to the right buttocks, able to visualize can only be felt with palpation as it is currently underneath the skin  Neurological:     Mental Status: She is alert and oriented to person, place, and time. Mental status is at baseline.   Psychiatric:        Mood and Affect: Mood normal.        Behavior: Behavior normal.      UC Treatments / Results  Labs (all labs ordered are listed, but only abnormal results are displayed) Labs Reviewed  CERVICOVAGINAL ANCILLARY ONLY    EKG   Radiology No results found.  Procedures Procedures (including critical care time)  Medications Ordered in UC Medications - No data to display  Initial Impression / Assessment and Plan / UC Course  I have reviewed the triage vital signs and the nursing notes.  Pertinent labs & imaging results that were available during my care of the patient were reviewed by me and considered in my medical decision making (see chart for details).  Abscess of right buttock Routine screening for STI  Abscess is unable to be drained today, discussed with patient, placed on doxycycline 7-day course and recommended warm compresses to the affected area at least 4 times daily, advised to return to urgent care for nonhealing nondraining site for reevaluation  STI labs are pending, will treat per protocol, advised abstinence until all labs have resulted, advised condom use during all sexual encounters moving forward, may follow-up with his urgent care as needed Final Clinical Impressions(s) / UC Diagnoses   Final diagnoses:  Routine screening for STI (sexually transmitted infection)  Abscess of right buttock     Discharge Instructions      Take doxycycline every morning and every evening for 7 days  Hold warm-hot compresses to affected area at least 4 times a day, this helps to facilitate draining, the more the better  Please return for evaluation for increased swelling, increased tenderness or pain, non healing site, non draining site, you begin to have fever or chills   We reviewed the etiology of recurrent abscesses of skin.  Skin abscesses are collections of pus within the dermis and deeper skin tissues. Skin abscesses manifest as painful,  tender, fluctuant, and erythematous nodules, frequently surmounted by a pustule  and surrounded by a rim of erythematous swelling.  Spontaneous drainage of purulent material may occur.  Fever can occur on occasion.    -Skin abscesses can develop in healthy individuals with no predisposing conditions other than skin or nasal carriage of Staphylococcus aureus.  Individuals in close contact with others who have active infection with skin abscesses are at increased risk which is likely to explain why twin brother has similar episodes.   In addition, any process leading to a breach in the skin barrier can also predispose to the development of a skin abscesses, such as atopic dermatitis.     Labs pending 2-3 days, you will be contacted if positive for any sti and treatment will be sent to the pharmacy, you will have to return to the clinic if positive for gonorrhea to receive treatment   Please refrain from having sex until labs results, if positive please refrain from having sex until treatment complete and symptoms resolve   If positive for  Chlamydia  gonorrhea or trichomoniasis please notify partner or partners so they may tested as well  Moving forward, it is recommended you use some form of protection against the transmission of sti infections  such as condoms or dental dams with each sexual encounter     ED Prescriptions     Medication Sig Dispense Auth. Provider   doxycycline (VIBRAMYCIN) 100 MG capsule Take 1 capsule (100 mg total) by mouth 2 (two) times daily. 14 capsule Ashely Joshua, Elita Boone, NP      PDMP not reviewed this encounter.   Valinda Hoar, NP 01/01/22 1916

## 2022-01-03 ENCOUNTER — Telehealth (HOSPITAL_COMMUNITY): Payer: Self-pay | Admitting: Emergency Medicine

## 2022-01-03 LAB — CERVICOVAGINAL ANCILLARY ONLY
Bacterial Vaginitis (gardnerella): POSITIVE — AB
Candida Glabrata: NEGATIVE
Candida Vaginitis: NEGATIVE
Chlamydia: NEGATIVE
Comment: NEGATIVE
Comment: NEGATIVE
Comment: NEGATIVE
Comment: NEGATIVE
Comment: NEGATIVE
Comment: NORMAL
Neisseria Gonorrhea: NEGATIVE
Trichomonas: NEGATIVE

## 2022-01-03 MED ORDER — METRONIDAZOLE 500 MG PO TABS: 500.0000 mg | ORAL_TABLET | Freq: Two times a day (BID) | ORAL | 0 refills | Status: DC

## 2022-03-10 ENCOUNTER — Telehealth: Payer: Medicaid Other | Admitting: Family

## 2022-03-10 DIAGNOSIS — B3731 Acute candidiasis of vulva and vagina: Secondary | ICD-10-CM | POA: Diagnosis not present

## 2022-03-10 MED ORDER — FLUCONAZOLE 150 MG PO TABS: 150.0000 mg | ORAL_TABLET | ORAL | 0 refills | Status: DC | PRN

## 2022-03-10 NOTE — Progress Notes (Signed)

## 2022-04-13 ENCOUNTER — Encounter: Payer: Self-pay | Admitting: Obstetrics and Gynecology

## 2022-04-18 ENCOUNTER — Encounter (HOSPITAL_COMMUNITY): Payer: Self-pay

## 2022-04-18 ENCOUNTER — Ambulatory Visit (HOSPITAL_COMMUNITY)
Admission: RE | Admit: 2022-04-18 | Discharge: 2022-04-18 | Disposition: A | Discharge status: Home / Self Care | Payer: Medicaid Other | Source: Ambulatory Visit | Attending: Family Medicine | Admitting: Family Medicine

## 2022-04-18 DIAGNOSIS — Z113 Encounter for screening for infections with a predominantly sexual mode of transmission: Secondary | ICD-10-CM | POA: Diagnosis not present

## 2022-04-18 DIAGNOSIS — Z202 Contact with and (suspected) exposure to infections with a predominantly sexual mode of transmission: Secondary | ICD-10-CM | POA: Diagnosis not present

## 2022-04-18 NOTE — Discharge Instructions (Signed)
Staff will notify you if anything is positive on your swab

## 2022-04-18 NOTE — ED Provider Notes (Signed)
[facility]    CSN: [id number] Arrival date & time: 04/18/22  1726      History   Chief Complaint Chief Complaint  Patient presents with   Exposure to STD    Entered by patient    HPI Melody Patrick is a 26 y.o. female.    Exposure to STD   Here for STD testing. No vag dc or dysuria. States has some menstrual cramps right now, on menstrual cycle. No f/c/n/v  Past Medical History:  Diagnosis Date   IBS (irritable bowel syndrome)     Patient Active Problem List   Diagnosis Date Noted   On Depo-Provera for contraception 10/09/2016    Past Surgical History:  Procedure Laterality Date   WISDOM TOOTH EXTRACTION      OB History     Gravida  2   Para  1   Term  1   Preterm      AB  1   Living  1      SAB  1   IAB      Ectopic      Multiple  0   Live Births  1            Home Medications    Prior to Admission medications   Medication Sig Start Date End Date Taking? Authorizing Provider  medroxyPROGESTERone (PROVERA) 10 MG tablet Take 1 tablet (10 mg total) by mouth daily for 7 days. 10/18/21 8/92/11  Copland, Deirdre Evener, PA-C  Norethindrone Acetate-Ethinyl Estrad-FE (MICROGESTIN 24 FE) 1-20 MG-MCG(24) tablet Take 1 tablet by mouth daily. 02/19/16   Copland, Deirdre Evener, PA-C  cetirizine (ZYRTEC) 10 MG tablet Take 1 tablet (10 mg total) by mouth 2 (two) times daily. Patient not taking: Reported on 01/23/2020 11/12/19 01/25/20  Raylene Everts, MD    Family History Family History  Problem Relation Age of Onset   Healthy Mother    Breast cancer Maternal Aunt        40s/50s   Breast cancer Maternal Aunt        40s/50s   Ovarian cancer Maternal Aunt        40s/50s   Hypertension Maternal Grandmother     Social History Social History   Tobacco Use   Smoking status: Former    Types: Cigarettes   Smokeless tobacco: Never  Vaping Use   Vaping Use: Some days  Substance Use Topics   Alcohol use: Yes    Comment: occasionally    Drug use: Not Currently    Types: Marijuana     Allergies   Patient has no known allergies.   Review of Systems Review of Systems   Physical Exam Triage Vital Signs ED Triage Vitals  Enc Vitals Group     BP      Pulse      Resp      Temp      Temp src      SpO2      Weight      Height      Head Circumference      Peak Flow      Pain Score      Pain Loc      Pain Edu?      Excl. in [place]?    No data found.  Updated Vital Signs There were no vitals taken for this visit.  Visual Acuity Right Eye Distance:   Left Eye Distance:   Bilateral Distance:  Right Eye Near:   Left Eye Near:    Bilateral Near:     Physical Exam Vitals reviewed.  Constitutional:      General: She is not in acute distress.    Appearance: She is not ill-appearing, toxic-appearing or diaphoretic.  Cardiovascular:     Rate and Rhythm: Normal rate and regular rhythm.  Skin:    Coloration: Skin is not pale.  Neurological:     Mental Status: She is alert and oriented to person, place, and time.  Psychiatric:        Behavior: Behavior normal.      UC Treatments / Results  Labs (all labs ordered are listed, but only abnormal results are displayed) Labs Reviewed  CERVICOVAGINAL ANCILLARY ONLY    EKG   Radiology No results found.  Procedures Procedures (including critical care time)  Medications Ordered in UC Medications - No data to display  Initial Impression / Assessment and Plan / UC Course  I have reviewed the triage vital signs and the nursing notes.  Pertinent labs & imaging results that were available during my care of the patient were reviewed by me and considered in my medical decision making (see chart for details).        She had HIV screening in April. Staff will notify her of any positives on the swab and treat per protocol. Final Clinical Impressions(s) / UC Diagnoses   Final diagnoses:  Screen for STD (sexually transmitted disease)      Discharge Instructions      Staff will notify you if anything is positive on your swab     ED Prescriptions   None    PDMP not reviewed this encounter.   Barrett Henle, MD 04/18/22 Tresa Moore

## 2022-04-18 NOTE — ED Triage Notes (Signed)
Pt presents to office for STD testing. Denies any vaginal discharge at this time.

## 2022-04-19 ENCOUNTER — Telehealth (HOSPITAL_COMMUNITY): Payer: Self-pay | Admitting: Emergency Medicine

## 2022-04-19 LAB — CERVICOVAGINAL ANCILLARY ONLY
Bacterial Vaginitis (gardnerella): POSITIVE — AB
Candida Glabrata: NEGATIVE
Candida Vaginitis: NEGATIVE
Chlamydia: NEGATIVE
Comment: NEGATIVE
Comment: NEGATIVE
Comment: NEGATIVE
Comment: NEGATIVE
Comment: NEGATIVE
Comment: NORMAL
Neisseria Gonorrhea: NEGATIVE
Trichomonas: NEGATIVE

## 2022-04-19 MED ORDER — METRONIDAZOLE 500 MG PO TABS: 500.0000 mg | ORAL_TABLET | Freq: Two times a day (BID) | ORAL | 0 refills | Status: DC

## 2022-04-24 ENCOUNTER — Telehealth: Payer: Medicaid Other | Admitting: Physician Assistant

## 2022-04-24 DIAGNOSIS — Z872 Personal history of diseases of the skin and subcutaneous tissue: Secondary | ICD-10-CM | POA: Diagnosis not present

## 2022-04-24 MED ORDER — TRIAMCINOLONE ACETONIDE 0.1 % EX CREA: 1.0000 | TOPICAL_CREAM | Freq: Two times a day (BID) | CUTANEOUS | 0 refills | Status: DC

## 2022-04-24 NOTE — Progress Notes (Signed)
I have spent 5 minutes in review of e-visit questionnaire, review and updating patient chart, medical decision making and response to patient.   Tracy Gerken Cody Devan Danzer, PA-C    

## 2022-04-24 NOTE — Progress Notes (Signed)

## 2022-06-20 ENCOUNTER — Telehealth: Payer: Medicaid Other | Admitting: Physician Assistant

## 2022-06-20 DIAGNOSIS — J111 Influenza due to unidentified influenza virus with other respiratory manifestations: Secondary | ICD-10-CM

## 2022-06-20 MED ORDER — OSELTAMIVIR PHOSPHATE 75 MG PO CAPS: 75.0000 mg | ORAL_CAPSULE | Freq: Two times a day (BID) | ORAL | 0 refills | Status: DC

## 2022-06-20 NOTE — Progress Notes (Signed)
E visit for Flu like symptoms   We are sorry that you are not feeling well.  Here is how we plan to help! Based on what you have shared with me it looks like you may have a respiratory virus that may be influenza.  Influenza or "the flu" is   an infection caused by a respiratory virus. The flu virus is highly contagious and persons who did not receive their yearly flu vaccination may "catch" the flu from close contact.  We have anti-viral medications to treat the viruses that cause this infection. They are not a "cure" and only shorten the course of the infection. These prescriptions are most effective when they are given within the first 2 days of "flu" symptoms. Antiviral medication are indicated if you have a high risk of complications from the flu. You should  also consider an antiviral medication if you are in close contact with someone who is at risk. These medications can help patients avoid complications from the flu  but have side effects that you should know. Possible side effects from Tamiflu or oseltamivir include nausea, vomiting, diarrhea, dizziness, headaches, eye redness, sleep problems or other respiratory symptoms. You should not take Tamiflu if you have an allergy to oseltamivir or any to the ingredients in Tamiflu.  Based upon your symptoms and potential risk factors I have prescribed Oseltamivir (Tamiflu).  It has been sent to your designated pharmacy.  You will take one 75 mg capsule orally twice a day for the next 5 days.  ANYONE WHO HAS FLU SYMPTOMS SHOULD: Stay home. The flu is highly contagious and going out or to work exposes others! Be sure to drink plenty of fluids. Water is fine as well as fruit juices, sodas and electrolyte beverages. You may want to stay away from caffeine or alcohol. If you are nauseated, try taking small sips of liquids. How do you know if you are getting enough fluid? Your urine should be a pale yellow or almost colorless. Get rest. Taking a steamy  shower or using a humidifier may help nasal congestion and ease sore throat pain. Using a saline nasal spray works much the same way. Cough drops, hard candies and sore throat lozenges may ease your cough. Line up a caregiver. Have someone check on you regularly.   GET HELP RIGHT AWAY IF: You cannot keep down liquids or your medications. You become short of breath Your fell like you are going to pass out or loose consciousness. Your symptoms persist after you have completed your treatment plan MAKE SURE YOU  Understand these instructions. Will watch your condition. Will get help right away if you are not doing well or get worse.  Your e-visit answers were reviewed by a board certified advanced clinical practitioner to complete your personal care plan.  Depending on the condition, your plan could have included both over the counter or prescription medications.  If there is a problem please reply  once you have received a response from your provider.  Your safety is important to us.  If you have drug allergies check your prescription carefully.    You can use MyChart to ask questions about today's visit, request a non-urgent call back, or ask for a work or school excuse for 24 hours related to this e-Visit. If it has been greater than 24 hours you will need to follow up with your provider, or enter a new e-Visit to address those concerns.  You will get an e-mail in the next   two days asking about your experience.  I hope that your e-visit has been valuable and will speed your recovery. Thank you for using e-visits.  I have spent 5 minutes in review of e-visit questionnaire, review and updating patient chart, medical decision making and response to patient.   Gracelyn Coventry M Paulla Mcclaskey, PA-C  

## 2022-07-09 ENCOUNTER — Encounter: Payer: Self-pay | Admitting: Obstetrics and Gynecology

## 2022-07-24 ENCOUNTER — Ambulatory Visit (HOSPITAL_COMMUNITY): Payer: Medicaid Other

## 2022-07-26 ENCOUNTER — Ambulatory Visit (HOSPITAL_COMMUNITY): Payer: Medicaid Other

## 2022-08-03 ENCOUNTER — Ambulatory Visit (HOSPITAL_COMMUNITY): Payer: Medicaid Other

## 2022-08-20 ENCOUNTER — Encounter: Payer: Self-pay | Admitting: Obstetrics and Gynecology

## 2022-08-20 ENCOUNTER — Ambulatory Visit (INDEPENDENT_AMBULATORY_CARE_PROVIDER_SITE_OTHER): Payer: Medicaid Other | Admitting: Obstetrics and Gynecology

## 2022-08-20 VITALS — BP 110/70 | Ht 63.0 in | Wt 178.0 lb

## 2022-08-20 DIAGNOSIS — N643 Galactorrhea not associated with childbirth: Secondary | ICD-10-CM

## 2022-08-20 DIAGNOSIS — Z01419 Encounter for gynecological examination (general) (routine) without abnormal findings: Secondary | ICD-10-CM | POA: Diagnosis not present

## 2022-08-20 DIAGNOSIS — N912 Amenorrhea, unspecified: Secondary | ICD-10-CM

## 2022-08-20 DIAGNOSIS — Z803 Family history of malignant neoplasm of breast: Secondary | ICD-10-CM | POA: Insufficient documentation

## 2022-08-20 MED ORDER — MEDROXYPROGESTERONE ACETATE 10 MG PO TABS: 10.0000 mg | ORAL_TABLET | Freq: Every day | ORAL | 0 refills | Status: DC

## 2022-08-20 NOTE — Patient Instructions (Signed)
I value your feedback and you entrusting us with your care. If you get a [facility] patient survey, I would appreciate you taking the time to let us know about your experience today. Thank you! ? ? ?

## 2022-08-20 NOTE — Progress Notes (Signed)
PCP:  Pcp, No   Chief Complaint  Patient presents with   Gynecologic Exam    Had fluid come out of left breast on two different occassions, no tenderness/pain     HPI:      [name] is a 27 y.o. G2P1011 whose LMP was Patient's last menstrual period was 07/31/2022 (exact date)., presents today for her annual examination.  Her menses are every 90 days with provera use (after neg UPT), lasting 3-5 days, light to mod flow, no BTB, no dysmen. Pt had spontaneous menses 2/24 that was 7 days light flow only, no dysmen. Sx and labs suggestive of PCOS. Had monthly menses before last pregnancy. Did depo after last pregnancy and hasn't had normal periods since.   Sex activity: single partner, contraception - none. Conception ok. Stopped taking PNVs. Last Pap: 07/16/21 Results were: no abnormalities /neg HPV DNA  There is a FH of breast cancer in 2 mat grt aunts. There is a possible FH of ovarian cancer in another mat grt aunts. The patient does not do self-breast exams. Noticed clear nipple d/c with manipulation yesterday; no masses. Normal TSH/prolactin 1/23  Tobacco use: black and milds daily Alcohol use: social drinker Daily MJ use.   Exercise: moderately active  She does not get adequate calcium and Vitamin D in her diet.  Patient Active Problem List   Diagnosis Date Noted   Family history of breast cancer 08/20/2022   On Depo-Provera for contraception 10/09/2016    Past Surgical History:  Procedure Laterality Date   WISDOM TOOTH EXTRACTION      Family History  Problem Relation Age of Onset   Healthy Mother    Breast cancer Maternal Aunt        40s/50s   Breast cancer Maternal Aunt        40s/50s   Ovarian cancer Maternal Aunt        40s/50s   Hypertension Maternal Grandmother     Social History   Socioeconomic History   Marital status: Single    Spouse name: Not on file   Number of children: Not on file   Years of education: Not on file   Highest  education level: Not on file  Occupational History   Not on file  Tobacco Use   Smoking status: Every Day    Types: Cigars   Smokeless tobacco: Never  Vaping Use   Vaping Use: Former  Substance and Sexual Activity   Alcohol use: Yes    Comment: occasionally   Drug use: Yes    Types: Marijuana   Sexual activity: Yes    Birth control/protection: None  Other Topics Concern   Not on file  Social History Narrative   Not on file   Social Determinants of Health   Financial Resource Strain: Not on file  Food Insecurity: Not on file  Transportation Needs: Not on file  Physical Activity: Not on file  Stress: Not on file  Social Connections: Not on file  Intimate Partner Violence: Not on file     Current Outpatient Medications:    triamcinolone cream (KENALOG) 0.1 %, Apply 1 Application topically 2 (two) times daily., Disp: 30 g, Rfl: 0   medroxyPROGESTERone (PROVERA) 10 MG tablet, Take 1 tablet (10 mg total) by mouth daily for 7 days. Q90 days without spontaneous menses, Disp: 28 tablet, Rfl: 0     ROS:  Review of Systems  Constitutional:  Negative for fatigue, fever and unexpected weight change.  Respiratory:  Negative for cough, shortness of breath and wheezing.   Cardiovascular:  Negative for chest pain, palpitations and leg swelling.  Gastrointestinal:  Negative for blood in stool, constipation, diarrhea, nausea and vomiting.  Endocrine: Negative for cold intolerance, heat intolerance and polyuria.  Genitourinary:  Negative for dyspareunia, dysuria, flank pain, frequency, genital sores, hematuria, menstrual problem, pelvic pain, urgency, vaginal bleeding, vaginal discharge and vaginal pain.  Musculoskeletal:  Negative for back pain, joint swelling and myalgias.  Skin:  Negative for rash.  Neurological:  Negative for dizziness, syncope, light-headedness, numbness and headaches.  Hematological:  Negative for adenopathy.  Psychiatric/Behavioral:  Negative for agitation,  confusion, sleep disturbance and suicidal ideas. The patient is not nervous/anxious.    BREAST: nipple d/c   Objective: BP 110/70   Ht '5\' 3"'$  (1.6 m)   Wt 178 lb (80.7 kg)   LMP 07/31/2022 (Exact Date)   BMI 31.53 kg/m    Physical Exam Constitutional:      Appearance: She is well-developed.  Genitourinary:     Vulva normal.     Right Labia: No rash, tenderness or lesions.    Left Labia: No tenderness, lesions or rash.    No vaginal discharge, erythema or tenderness.      Right Adnexa: not tender and no mass present.    Left Adnexa: not tender and no mass present.    No cervical friability or polyp.     Uterus is not enlarged or tender.  Breasts:    Right: No mass, nipple discharge, skin change or tenderness.     Left: No mass, nipple discharge, skin change or tenderness.  Neck:     Thyroid: No thyromegaly.  Cardiovascular:     Rate and Rhythm: Normal rate and regular rhythm.     Heart sounds: Normal heart sounds. No murmur heard. Pulmonary:     Effort: Pulmonary effort is normal.     Breath sounds: Normal breath sounds.  Abdominal:     Palpations: Abdomen is soft.     Tenderness: There is no abdominal tenderness. There is no guarding or rebound.  Musculoskeletal:        General: Normal range of motion.     Cervical back: Normal range of motion.  Lymphadenopathy:     Cervical: No cervical adenopathy.  Neurological:     General: No focal deficit present.     Mental Status: She is alert and oriented to person, place, and time.     Cranial Nerves: No cranial nerve deficit.  Skin:    General: Skin is warm and dry.  Psychiatric:        Mood and Affect: Mood normal.        Behavior: Behavior normal.        Thought Content: Thought content normal.        Judgment: Judgment normal.  Vitals reviewed.     Assessment/Plan: Encounter for annual routine gynecological examination  Amenorrhea - Plan: medroxyPROGESTERone (PROVERA) 10 MG tablet; pt doing well with Q90  days dosing and wants to cont. Labs suggestive of PCOS. Pt would like to conceive. Restart PNVs. F/u prn.   Galactorrhea--neg exam, neg labs 1/23. D/C MJ use (could affect sx). F/u prn.   Meds ordered this encounter  Medications   medroxyPROGESTERone (PROVERA) 10 MG tablet    Sig: Take 1 tablet (10 mg total) by mouth daily for 7 days. Q90 days without spontaneous menses    Dispense:  28 tablet    Refill:  0  Order Specific Question:   Supervising Provider    Answer:   Renaldo Reel             GYN counsel adequate intake of calcium and vitamin D, diet and exercise     F/U  Return in about 1 year (around 08/20/2023).  Tequlia Gonsalves B. Shakesha Soltau, PA-C 08/20/2022 4:05 PM

## 2022-10-03 ENCOUNTER — Ambulatory Visit: Payer: Medicaid Other | Admitting: Family Medicine

## 2022-10-03 ENCOUNTER — Telehealth: Payer: Self-pay

## 2022-10-03 NOTE — Telephone Encounter (Signed)
Pt is a no show for a NP appt with Dr Doreene Burke on 10/03/22, I did not send a no show letter.

## 2022-10-10 ENCOUNTER — Telehealth: Payer: Medicaid Other | Admitting: Physician Assistant

## 2022-10-10 DIAGNOSIS — R112 Nausea with vomiting, unspecified: Secondary | ICD-10-CM

## 2022-10-10 DIAGNOSIS — R519 Headache, unspecified: Secondary | ICD-10-CM

## 2022-10-10 NOTE — Progress Notes (Signed)
Because of concern of possible pregnancy and giving the headache "came out of nowhere" and caused vomiting, this is something that needs examination and I feel your condition warrants further evaluation and I recommend that you be seen in a face to face visit.   NOTE: There will be NO CHARGE for this eVisit   If you are having a true medical emergency please call 911.      For an urgent face to face visit, Melody Patrick has eight urgent care centers for your convenience:   NEW!! [facility] Health Urgent Care Center at [facility] Get Driving Directions [phone] [address], [address] [place], [address]    [facility] Health Urgent Care Center at [facility] Get Driving Directions [phone] [address] [place], [place] [address]   [facility] Health Urgent Care Center [facility]) Get Driving Directions [phone] [address] [place], [place] [address]  [facility] Health Urgent Care Center [facility]) Get Driving Directions [phone] [address] [place],  [place]  [address]  [facility] Health Urgent Care Center [facility] - at [employer]  [phone] [address] [place],  [place] [address]   [facility] Health Urgent Care at [facility] Get Driving Directions [phone] [address] [place] [address] [place], [place] [address]   [facility] Health Urgent Care at [facility] Get Driving Directions  [phone] [address] [place], [place] [address]   [facility] Health Urgent Care at [facility] Directions [phone] [address], [address] [place], [place] [address]  Your MyChart E-visit questionnaire answers were reviewed by a board certified advanced clinical practitioner to complete your personal care plan based on your specific symptoms.  Thank you for using e-Visits.

## 2022-10-11 ENCOUNTER — Ambulatory Visit
Admission: RE | Admit: 2022-10-11 | Discharge: 2022-10-11 | Disposition: A | Discharge status: Home / Self Care | Payer: Medicaid Other | Source: Ambulatory Visit | Attending: Urgent Care | Admitting: Urgent Care

## 2022-10-11 VITALS — BP 133/91 | HR 73 | Temp 98.6°F | Resp 20

## 2022-10-11 DIAGNOSIS — N939 Abnormal uterine and vaginal bleeding, unspecified: Secondary | ICD-10-CM | POA: Diagnosis not present

## 2022-10-11 DIAGNOSIS — E282 Polycystic ovarian syndrome: Secondary | ICD-10-CM | POA: Diagnosis not present

## 2022-10-11 LAB — POCT URINALYSIS DIP (MANUAL ENTRY)
Bilirubin, UA: NEGATIVE
Glucose, UA: NEGATIVE mg/dL
Ketones, POC UA: NEGATIVE mg/dL
Nitrite, UA: NEGATIVE
Protein Ur, POC: NEGATIVE mg/dL
Spec Grav, UA: 1.025 (ref 1.010–1.025)
Urobilinogen, UA: 0.2 E.U./dL
pH, UA: 6 (ref 5.0–8.0)

## 2022-10-11 LAB — POCT URINE PREGNANCY: Preg Test, Ur: NEGATIVE

## 2022-10-11 NOTE — Discharge Instructions (Signed)
We will let you know about your results on Monday. Otherwise, make a follow up appointment with your gynecologist.

## 2022-10-11 NOTE — ED Triage Notes (Addendum)
Pt c/o HA and vomited 2 days ago-denies HA at present-states she has issues with menstrual periods and is being seen by GYN-last visit was 08/23/22-pt concerned she may be pregnant and also requests STD testing-pt states she noticed blood on tissue when she wiped after voiding today-NAD-steady gait

## 2022-10-11 NOTE — ED Provider Notes (Addendum)
[facility] - URGENT CARE CENTER  Note:  This document was prepared using Conservation officer, historic buildings and may include unintentional dictation errors.  MRN: [id number] DOB: [birth year]  Subjective:   Melody Patrick is a 27 y.o. female presenting for 1 week history of intermittent vaginal bleeding. Had her last cycle 09/23/2022. This is irregular bleeding for her. Bleeding is improved today.  Has a history of PCOS. Sees a gynecologist but has not follow up with them. Did a pregnancy test at home and was negative. Would like to be checked for STIs. Denies fever, n/v, abdominal pain, pelvic pain, rashes, dysuria, urinary frequency, hematuria, vaginal discharge.  She did have a headache and a couple of episodes of vomiting this past week.   No current facility-administered medications for this encounter.  Current Outpatient Medications:    medroxyPROGESTERone (PROVERA) 10 MG tablet, Take 1 tablet (10 mg total) by mouth daily for 7 days. Q90 days without spontaneous menses, Disp: 28 tablet, Rfl: 0   triamcinolone cream (KENALOG) 0.1 %, Apply 1 Application topically 2 (two) times daily., Disp: 30 g, Rfl: 0   No Known Allergies  Past Medical History:  Diagnosis Date   Eczema    IBS (irritable bowel syndrome)      Past Surgical History:  Procedure Laterality Date   WISDOM TOOTH EXTRACTION      Family History  Problem Relation Age of Onset   Healthy Mother    Breast cancer Maternal Aunt        40s/50s   Breast cancer Maternal Aunt        40s/50s   Ovarian cancer Maternal Aunt        40s/50s   Hypertension Maternal Grandmother     Social History   Tobacco Use   Smoking status: Every Day    Types: Cigars   Smokeless tobacco: Never  Vaping Use   Vaping Use: Former  Substance Use Topics   Alcohol use: Yes    Comment: occasionally   Drug use: Yes    Types: Marijuana    ROS   Objective:   Vitals: BP (!) 133/91 (BP Location: Right Arm)   Pulse 73   Temp  98.6 F (37 C) (Oral)   Resp 20   SpO2 98%   Physical Exam Constitutional:      General: She is not in acute distress.    Appearance: Normal appearance. She is well-developed. She is not ill-appearing, toxic-appearing or diaphoretic.  HENT:     Head: Normocephalic and atraumatic.     Nose: Nose normal.     Mouth/Throat:     Mouth: Mucous membranes are moist.  Eyes:     General: No scleral icterus.       Right eye: No discharge.        Left eye: No discharge.     Extraocular Movements: Extraocular movements intact.     Conjunctiva/sclera: Conjunctivae normal.  Cardiovascular:     Rate and Rhythm: Normal rate.  Pulmonary:     Effort: Pulmonary effort is normal.  Abdominal:     General: Bowel sounds are normal. There is no distension.     Palpations: Abdomen is soft. There is no mass.     Tenderness: There is no abdominal tenderness. There is no right CVA tenderness, left CVA tenderness, guarding or rebound.  Skin:    General: Skin is warm and dry.  Neurological:     General: No focal deficit present.     Mental Status:  She is alert and oriented to person, place, and time.     Cranial Nerves: No cranial nerve deficit.     Motor: No weakness.     Coordination: Coordination normal.     Gait: Gait normal.  Psychiatric:        Mood and Affect: Mood normal.        Behavior: Behavior normal.        Thought Content: Thought content normal.        Judgment: Judgment normal.     Results for orders placed or performed during the hospital encounter of 10/11/22 (from the past 24 hour(s))  POCT urinalysis dipstick     Status: Abnormal   Collection Time: 10/11/22 10:06 AM  Result Value Ref Range   Color, UA yellow yellow   Clarity, UA clear clear   Glucose, UA negative negative mg/dL   Bilirubin, UA negative negative   Ketones, POC UA negative negative mg/dL   Spec Grav, UA 9.604 5.409 - 1.025   Blood, UA large (A) negative   pH, UA 6.0 5.0 - 8.0   Protein Ur, POC negative  negative mg/dL   Urobilinogen, UA 0.2 0.2 or 1.0 E.U./dL   Nitrite, UA Negative Negative   Leukocytes, UA Trace (A) Negative  POCT urine pregnancy     Status: None   Collection Time: 10/11/22 10:06 AM  Result Value Ref Range   Preg Test, Ur Negative Negative    Assessment and Plan :   PDMP not reviewed this encounter.  1. Abnormal uterine bleeding   2. PCOS (polycystic ovarian syndrome)    Patient declined empiric treatment. Labs pending. Follow up with gynecologist.     Wallis Bamberg, PA-C 10/11/22 1030

## 2022-10-14 LAB — CERVICOVAGINAL ANCILLARY ONLY
Bacterial Vaginitis (gardnerella): POSITIVE — AB
Candida Glabrata: NEGATIVE
Candida Vaginitis: POSITIVE — AB
Chlamydia: NEGATIVE
Comment: NEGATIVE
Comment: NEGATIVE
Comment: NEGATIVE
Comment: NEGATIVE
Comment: NEGATIVE
Comment: NORMAL
Neisseria Gonorrhea: NEGATIVE
Trichomonas: NEGATIVE

## 2022-10-15 ENCOUNTER — Telehealth (HOSPITAL_COMMUNITY): Payer: Self-pay | Admitting: Emergency Medicine

## 2022-10-15 ENCOUNTER — Encounter: Payer: Self-pay | Admitting: Obstetrics and Gynecology

## 2022-10-15 MED ORDER — METRONIDAZOLE 0.75 % VA GEL: 1.0000 | Freq: Every day | VAGINAL | 0 refills | Status: AC

## 2022-10-15 MED ORDER — FLUCONAZOLE 150 MG PO TABS: 150.0000 mg | ORAL_TABLET | Freq: Once | ORAL | 0 refills | Status: AC

## 2022-11-05 ENCOUNTER — Ambulatory Visit: Payer: Medicaid Other

## 2022-11-12 ENCOUNTER — Ambulatory Visit: Payer: Medicaid Other

## 2022-11-14 ENCOUNTER — Ambulatory Visit: Payer: Medicaid Other

## 2022-11-19 ENCOUNTER — Ambulatory Visit: Payer: Medicaid Other

## 2023-01-08 ENCOUNTER — Ambulatory Visit
Admission: EM | Admit: 2023-01-08 | Discharge: 2023-01-08 | Disposition: A | Discharge status: Home / Self Care | Payer: Medicaid Other | Attending: Internal Medicine | Admitting: Internal Medicine

## 2023-01-08 ENCOUNTER — Ambulatory Visit: Payer: Self-pay

## 2023-01-08 DIAGNOSIS — Z9189 Other specified personal risk factors, not elsewhere classified: Secondary | ICD-10-CM | POA: Diagnosis not present

## 2023-01-08 DIAGNOSIS — N3001 Acute cystitis with hematuria: Secondary | ICD-10-CM | POA: Diagnosis not present

## 2023-01-08 DIAGNOSIS — Z3202 Encounter for pregnancy test, result negative: Secondary | ICD-10-CM | POA: Diagnosis not present

## 2023-01-08 LAB — POCT URINALYSIS DIP (MANUAL ENTRY)
Bilirubin, UA: NEGATIVE
Glucose, UA: NEGATIVE mg/dL
Ketones, POC UA: NEGATIVE mg/dL
Nitrite, UA: NEGATIVE
Protein Ur, POC: NEGATIVE mg/dL
Spec Grav, UA: 1.025 (ref 1.010–1.025)
Urobilinogen, UA: 0.2 E.U./dL
pH, UA: 5.5 (ref 5.0–8.0)

## 2023-01-08 LAB — POCT URINE PREGNANCY: Preg Test, Ur: NEGATIVE

## 2023-01-08 MED ORDER — CEPHALEXIN 500 MG PO CAPS: 500.0000 mg | ORAL_CAPSULE | Freq: Two times a day (BID) | ORAL | 0 refills | Status: DC

## 2023-01-08 NOTE — ED Provider Notes (Signed)
[facility]    CSN: [id number] Arrival date & time: 01/08/23  1601      History   Chief Complaint No chief complaint on file.   HPI Melody Patrick is a 27 y.o. female.   Patient presents to urgent care for evaluation of dysuria, urinary urgency, and urinary frequency that started 2 days ago. In addition, reports vaginal itching that started 2 days ago without vaginal odor, discharge, or rash. No N/V/D, abdominal pain, dizziness, flank pain, or fever/chills. She loves to drink sprite and admits to minimal water intake. She is not a diabetic and does not take an SGLT-2 inhibitor. LMP was 12/25/2022. Recently participated in unprotected sexual intercourse with new female partner 2 days ago and would like to be tested for STDs. Unsure of any known exposures to STD.  No attempted use of any OTC medications PTA.      Past Medical History:  Diagnosis Date   Eczema    IBS (irritable bowel syndrome)     Patient Active Problem List   Diagnosis Date Noted   Family history of breast cancer 08/20/2022   On Depo-Provera for contraception 10/09/2016    Past Surgical History:  Procedure Laterality Date   WISDOM TOOTH EXTRACTION      OB History     Gravida  2   Para  1   Term  1   Preterm      AB  1   Living  1      SAB  1   IAB      Ectopic      Multiple  0   Live Births  1            Home Medications    Prior to Admission medications   Medication Sig Start Date End Date Taking? Authorizing Provider  cephALEXin (KEFLEX) 500 MG capsule Take 1 capsule (500 mg total) by mouth 2 (two) times daily for 7 days. 01/08/23 01/15/23 Yes Carlisle Beers, FNP  medroxyPROGESTERone (PROVERA) 10 MG tablet Take 1 tablet (10 mg total) by mouth daily for 7 days. Q90 days without spontaneous menses 08/20/22 08/27/22  Copland, Helmut Muster B, PA-C  triamcinolone cream (KENALOG) 0.1 % Apply 1 Application topically 2 (two) times daily. 04/24/22   Waldon Merl, PA-C   cetirizine (ZYRTEC) 10 MG tablet Take 1 tablet (10 mg total) by mouth 2 (two) times daily. Patient not taking: Reported on 01/23/2020 11/12/19 01/25/20  Eustace Moore, MD    Family History Family History  Problem Relation Age of Onset   Healthy Mother    Breast cancer Maternal Aunt        40s/50s   Breast cancer Maternal Aunt        40s/50s   Ovarian cancer Maternal Aunt        40s/50s   Hypertension Maternal Grandmother     Social History Social History   Tobacco Use   Smoking status: Every Day    Types: Cigars   Smokeless tobacco: Never  Vaping Use   Vaping status: Former  Substance Use Topics   Alcohol use: Yes    Comment: occasionally   Drug use: Yes    Types: Marijuana     Allergies   Patient has no known allergies.   Review of Systems Review of Systems Per HPI  Physical Exam Triage Vital Signs ED Triage Vitals  Encounter Vitals Group     BP 01/08/23 1613 124/85     Systolic  BP Percentile --      Diastolic BP Percentile --      Pulse Rate 01/08/23 1613 80     Resp 01/08/23 1613 20     Temp 01/08/23 1613 97.6 F (36.4 C)     Temp Source 01/08/23 1613 Oral     SpO2 01/08/23 1613 99 %     Weight --      Height --      Head Circumference --      Peak Flow --      Pain Score 01/08/23 1614 0     Pain Loc --      Pain Education --      Exclude from Growth Chart --    No data found.  Updated Vital Signs BP 124/85 (BP Location: Right Arm)   Pulse 80   Temp 97.6 F (36.4 C) (Oral)   Resp 20   LMP 12/25/2022 (Exact Date)   SpO2 99%   Visual Acuity Right Eye Distance:   Left Eye Distance:   Bilateral Distance:    Right Eye Near:   Left Eye Near:    Bilateral Near:     Physical Exam Vitals and nursing note reviewed.  Constitutional:      Appearance: She is not ill-appearing or toxic-appearing.  HENT:     Head: Normocephalic and atraumatic.     Right Ear: Hearing and external ear normal.     Left Ear: Hearing and external ear  normal.     Nose: Nose normal.     Mouth/Throat:     Lips: Pink.  Eyes:     General: Lids are normal. Vision grossly intact. Gaze aligned appropriately.     Extraocular Movements: Extraocular movements intact.     Conjunctiva/sclera: Conjunctivae normal.  Pulmonary:     Effort: Pulmonary effort is normal.  Abdominal:     General: Bowel sounds are normal.     Palpations: Abdomen is soft.     Tenderness: There is no abdominal tenderness. There is no right CVA tenderness, left CVA tenderness or guarding.  Musculoskeletal:     Cervical back: Neck supple.  Skin:    General: Skin is warm and dry.     Capillary Refill: Capillary refill takes less than 2 seconds.     Findings: No rash.  Neurological:     General: No focal deficit present.     Mental Status: She is alert and oriented to person, place, and time. Mental status is at baseline.     Cranial Nerves: No dysarthria or facial asymmetry.  Psychiatric:        Mood and Affect: Mood normal.        Speech: Speech normal.        Behavior: Behavior normal.        Thought Content: Thought content normal.        Judgment: Judgment normal.      UC Treatments / Results  Labs (all labs ordered are listed, but only abnormal results are displayed) Labs Reviewed  POCT URINALYSIS DIP (MANUAL ENTRY) - Abnormal; Notable for the following components:      Result Value   Blood, UA trace-lysed (*)    Leukocytes, UA Moderate (2+) (*)    All other components within normal limits  URINE CULTURE  HIV ANTIBODY (ROUTINE TESTING W REFLEX)  RPR  POCT URINE PREGNANCY  CERVICOVAGINAL ANCILLARY ONLY    EKG   Radiology No results found.  Procedures Procedures (including critical care time)  Medications Ordered in UC Medications - No data to display  Initial Impression / Assessment and Plan / UC Course  I have reviewed the triage vital signs and the nursing notes.  Pertinent labs & imaging results that were available during my care of  the patient were reviewed by me and considered in my medical decision making (see chart for details).   1. At risk for STD due to unprotected sex, negative UPT STI labs pending.  Patient agrees to HIV and syphilis testing today since this has not been done in 1 year.  Will notify patient of positive results and treat accordingly when labs come back.  Patient to avoid sexual intercourse until screening testing comes back.  Education provided regarding safe sexual practices and patient encouraged to use protection to prevent spread of STIs.   2. Acute cystitis with hematuria Presentation is consistent with acute uncomplicated cystitis. Patient is nontoxic in appearance with hemodynamically stable vital signs. Low suspicion for acute pyelonephritis. Low suspicion for kidney stone or infected stone. No indication for labs or imaging at this time. Keflex antibiotic sent to pharmacy.  Urine culture pending. Patient to push fluids to stay well hydrated and reduce intake of known urinary irritants.  Counseled patient on potential for adverse effects with medications prescribed/recommended today, strict ER and return-to-clinic precautions discussed, patient verbalized understanding.    Final Clinical Impressions(s) / UC Diagnoses   Final diagnoses:  At risk for sexually transmitted disease due to unprotected sex  Acute cystitis with hematuria  Urine pregnancy test negative     Discharge Instructions      UTI:  Your urine shows you likely have a urinary tract infection. I have sent your urine for culture to confirm this. We will go ahead and have you start taking antibiotics due to your symptoms.  Take antibiotic as directed.  (Keflex 500mg  every 12 hours for 7 days) If you develop diarrhea while taking this medication you may purchase an over-the-counter probiotic or eat yogurt with live active cultures.  To avoid GI upset please take this medication with food. I have sent your urine for culture to  see what type of bacteria grows. We will call you if we need to change the treatment plan based on the results of your urine culture.  STD Testing: Your STD testing has been sent to the lab and will come back in the next 2 to 3 days.  We will call you if any of your results are positive requiring treatment and treat you at that time.   If you do not receive a phone call from [place], this means your testing was negative.  Avoid sexual intercourse until your STD results come back.  If any of your STD results are positive, you will need to avoid sexual intercourse for 7 days while you are being treated to prevent spread of STD.  Condom use is the best way to prevent spread of STDs.  If you develop any new or worsening symptoms or if your symptoms do not start to improve, pleases return here or follow-up with your primary care provider. If your symptoms are severe, please go to the emergency room.     ED Prescriptions     Medication Sig Dispense Auth. Provider   cephALEXin (KEFLEX) 500 MG capsule Take 1 capsule (500 mg total) by mouth 2 (two) times daily for 7 days. 14 capsule Carlisle Beers, FNP      PDMP not reviewed this encounter.   Dyonna Jaspers,  Donavan Burnet, FNP 01/08/23 [phone]

## 2023-01-08 NOTE — ED Triage Notes (Signed)
Pt reports she has burning with urination x 2 days   Pt reports last unprotected encounter was x 2 days.

## 2023-01-08 NOTE — Discharge Instructions (Addendum)
UTI:  Your urine shows you likely have a urinary tract infection. I have sent your urine for culture to confirm this. We will go ahead and have you start taking antibiotics due to your symptoms.  Take antibiotic as directed.  (Keflex 500mg  every 12 hours for 7 days) If you develop diarrhea while taking this medication you may purchase an over-the-counter probiotic or eat yogurt with live active cultures.  To avoid GI upset please take this medication with food. I have sent your urine for culture to see what type of bacteria grows. We will call you if we need to change the treatment plan based on the results of your urine culture.  STD Testing: Your STD testing has been sent to the lab and will come back in the next 2 to 3 days.  We will call you if any of your results are positive requiring treatment and treat you at that time.   If you do not receive a phone call from [place], this means your testing was negative.  Avoid sexual intercourse until your STD results come back.  If any of your STD results are positive, you will need to avoid sexual intercourse for 7 days while you are being treated to prevent spread of STD.  Condom use is the best way to prevent spread of STDs.  If you develop any new or worsening symptoms or if your symptoms do not start to improve, pleases return here or follow-up with your primary care provider. If your symptoms are severe, please go to the emergency room.

## 2023-01-09 ENCOUNTER — Ambulatory Visit
Admission: RE | Admit: 2023-01-09 | Discharge: 2023-01-09 | Disposition: A | Discharge status: Home / Self Care | Payer: Medicaid Other | Source: Ambulatory Visit | Attending: Family Medicine | Admitting: Family Medicine

## 2023-01-09 ENCOUNTER — Telehealth: Payer: Self-pay | Admitting: Emergency Medicine

## 2023-01-09 DIAGNOSIS — A549 Gonococcal infection, unspecified: Secondary | ICD-10-CM

## 2023-01-09 LAB — RPR: RPR Ser Ql: NONREACTIVE

## 2023-01-09 LAB — CERVICOVAGINAL ANCILLARY ONLY
Bacterial Vaginitis (gardnerella): POSITIVE — AB
Candida Glabrata: NEGATIVE
Candida Vaginitis: POSITIVE — AB
Comment: NEGATIVE
Comment: NEGATIVE
Comment: NEGATIVE
Comment: NEGATIVE
Comment: NORMAL
Neisseria Gonorrhea: POSITIVE — AB
Trichomonas: NEGATIVE

## 2023-01-09 LAB — HIV ANTIBODY (ROUTINE TESTING W REFLEX): HIV Screen 4th Generation wRfx: NONREACTIVE

## 2023-01-09 MED ORDER — DOXYCYCLINE HYCLATE 100 MG PO CAPS: 100.0000 mg | ORAL_CAPSULE | Freq: Two times a day (BID) | ORAL | 0 refills | Status: AC

## 2023-01-09 MED ORDER — METRONIDAZOLE 500 MG PO TABS: 500.0000 mg | ORAL_TABLET | Freq: Two times a day (BID) | ORAL | 0 refills | Status: DC

## 2023-01-09 MED ORDER — FLUCONAZOLE 150 MG PO TABS: 150.0000 mg | ORAL_TABLET | Freq: Once | ORAL | 0 refills | Status: AC

## 2023-01-09 MED ORDER — CEFTRIAXONE SODIUM 500 MG IJ SOLR
500.0000 mg | Freq: Once | INTRAMUSCULAR | Status: AC
  Administered 2023-01-09: 500 mg via INTRAMUSCULAR

## 2023-01-09 NOTE — Telephone Encounter (Signed)
Per protocol, patient will need treatment with IM Rocephin 500mg  for positive Gonorrhea Will also need treatment with Doxycycline, Metronidazole and DIflucan. Attempted to reach patient x 1, LVM Prescription sent to pharmacy on file [facility] notified

## 2023-01-09 NOTE — ED Triage Notes (Signed)
Pt +gonorrhea-presents for rocephin 500mg  IM-per Colin Mulders, NP pt was advised to continue keflex until urine cx resulted, in addition to rx x 3 sent in for STDs

## 2023-01-11 NOTE — ED Provider Notes (Signed)
Pt here for treatment only, erroneous provider encounter. Nurse visit only/   Bing Neighbors, NP 01/11/23 [phone]

## 2023-01-15 ENCOUNTER — Ambulatory Visit
Admission: EM | Admit: 2023-01-15 | Discharge: 2023-01-15 | Disposition: A | Discharge status: Home / Self Care | Payer: Medicaid Other | Attending: Physician Assistant | Admitting: Physician Assistant

## 2023-01-15 ENCOUNTER — Ambulatory Visit: Admit: 2023-01-15 | Payer: Medicaid Other

## 2023-01-15 DIAGNOSIS — Z202 Contact with and (suspected) exposure to infections with a predominantly sexual mode of transmission: Secondary | ICD-10-CM

## 2023-01-15 MED ORDER — CEFTRIAXONE SODIUM 500 MG IJ SOLR
500.0000 mg | INTRAMUSCULAR | Status: DC
Start: 2023-01-15 — End: 2023-01-16
  Administered 2023-01-15: 500 mg via INTRAMUSCULAR

## 2023-01-15 NOTE — ED Triage Notes (Signed)
Pt presents to UC to be retested for gonorrhea/chlamydia because she had intercourse and was exposed to it. Pt has been taking doxycycline for G/C with tomorrow being the last day. Denies symptoms at this time.

## 2023-01-15 NOTE — ED Provider Notes (Signed)
[facility]    CSN: [id number] Arrival date & time: 01/15/23  1922      History   Chief Complaint No chief complaint on file.   HPI Melody Patrick is a 27 y.o. female.   Patient presents today for repeat treatment of gonorrhea.  She was seen by our clinic on 01/08/2023 at which point she tested positive for gonorrhea, chlamydia, bacterial vaginosis, yeast.  She was treated on 01/09/2023.  Reports that her partner just tested positive and they had an unprotected sexual encounter after she received her injection.  She denies any symptoms including pelvic pain, abdominal pain, fever, nausea, vomiting, vaginal discharge.  She has no concern for pregnancy.  She is requesting retreatment given she notes that she has been exposed again.    Past Medical History:  Diagnosis Date   Eczema    IBS (irritable bowel syndrome)     Patient Active Problem List   Diagnosis Date Noted   Family history of breast cancer 08/20/2022   On Depo-Provera for contraception 10/09/2016    Past Surgical History:  Procedure Laterality Date   WISDOM TOOTH EXTRACTION      OB History     Gravida  2   Para  1   Term  1   Preterm      AB  1   Living  1      SAB  1   IAB      Ectopic      Multiple  0   Live Births  1            Home Medications    Prior to Admission medications   Medication Sig Start Date End Date Taking? Authorizing Provider  doxycycline (VIBRAMYCIN) 100 MG capsule Take 1 capsule (100 mg total) by mouth 2 (two) times daily for 7 days. 01/09/23 01/16/23  Merrilee Jansky, MD  medroxyPROGESTERone (PROVERA) 10 MG tablet Take 1 tablet (10 mg total) by mouth daily for 7 days. Q90 days without spontaneous menses 08/20/22 08/27/22  Copland, Helmut Muster B, PA-C  metroNIDAZOLE (FLAGYL) 500 MG tablet Take 1 tablet (500 mg total) by mouth 2 (two) times daily. 01/09/23   Merrilee Jansky, MD  triamcinolone cream (KENALOG) 0.1 % Apply 1 Application topically 2 (two)  times daily. 04/24/22   Waldon Merl, PA-C  cetirizine (ZYRTEC) 10 MG tablet Take 1 tablet (10 mg total) by mouth 2 (two) times daily. Patient not taking: Reported on 01/23/2020 11/12/19 01/25/20  Eustace Moore, MD    Family History Family History  Problem Relation Age of Onset   Healthy Mother    Breast cancer Maternal Aunt        40s/50s   Breast cancer Maternal Aunt        40s/50s   Ovarian cancer Maternal Aunt        40s/50s   Hypertension Maternal Grandmother     Social History Social History   Tobacco Use   Smoking status: Every Day    Types: Cigars   Smokeless tobacco: Never  Vaping Use   Vaping status: Former  Substance Use Topics   Alcohol use: Yes    Comment: occasionally   Drug use: Yes    Types: Marijuana     Allergies   Patient has no known allergies.   Review of Systems Review of Systems  Constitutional:  Negative for activity change, appetite change, fatigue and fever.  Gastrointestinal:  Negative for abdominal pain, diarrhea, nausea  and vomiting.  Genitourinary:  Negative for dysuria, frequency, pelvic pain, urgency, vaginal bleeding, vaginal discharge and vaginal pain.     Physical Exam Triage Vital Signs ED Triage Vitals  Encounter Vitals Group     BP 01/15/23 1947 124/85     Systolic BP Percentile --      Diastolic BP Percentile --      Pulse Rate 01/15/23 1947 68     Resp 01/15/23 1947 16     Temp 01/15/23 1947 98.7 F (37.1 C)     Temp Source 01/15/23 1947 Oral     SpO2 01/15/23 1947 98 %     Weight --      Height --      Head Circumference --      Peak Flow --      Pain Score 01/15/23 1949 0     Pain Loc --      Pain Education --      Exclude from Growth Chart --    No data found.  Updated Vital Signs BP 124/85 (BP Location: Right Arm)   Pulse 68   Temp 98.7 F (37.1 C) (Oral)   Resp 16   LMP 12/25/2022 (Exact Date)   SpO2 98%   Visual Acuity Right Eye Distance:   Left Eye Distance:   Bilateral Distance:     Right Eye Near:   Left Eye Near:    Bilateral Near:     Physical Exam Vitals reviewed.  Constitutional:      General: She is awake. She is not in acute distress.    Appearance: Normal appearance. She is well-developed. She is not ill-appearing.     Comments: Very pleasant female appears stated age in no acute distress sitting comfortably in exam room  HENT:     Head: Normocephalic and atraumatic.  Cardiovascular:     Rate and Rhythm: Normal rate and regular rhythm.     Heart sounds: Normal heart sounds, S1 normal and S2 normal. No murmur heard. Pulmonary:     Effort: Pulmonary effort is normal.     Breath sounds: Normal breath sounds. No wheezing, rhonchi or rales.     Comments: Clear to auscultation bilaterally Abdominal:     General: Bowel sounds are normal.     Palpations: Abdomen is soft.     Tenderness: There is no abdominal tenderness. There is no right CVA tenderness, left CVA tenderness, guarding or rebound.     Comments: Benign abdominal exam  Psychiatric:        Behavior: Behavior is cooperative.      UC Treatments / Results  Labs (all labs ordered are listed, but only abnormal results are displayed) Labs Reviewed - No data to display  EKG   Radiology No results found.  Procedures Procedures (including critical care time)  Medications Ordered in UC Medications  cefTRIAXone (ROCEPHIN) injection 500 mg (has no administration in time range)    Initial Impression / Assessment and Plan / UC Course  I have reviewed the triage vital signs and the nursing notes.  Pertinent labs & imaging results that were available during my care of the patient were reviewed by me and considered in my medical decision making (see chart for details).     Patient is well-appearing, afebrile, nontoxic, nontachycardic.  Discussed that there is not really a point in retesting or given it has only been 1 week since she was last tested.  Given she has had an additional exposure  in the  past few days following her treatment we will treat her again for gonorrhea today.  Rocephin 500 mg given in clinic without side effect or concern.  Discussed at length with patient the importance of abstaining from sex for 7 days after completing treatment.  We also discussed that all partners also need to abstain from sex after being treated as a possible to give this back-and-forth.  We discussed the importance of safe sex practices.  If she develops any symptoms she is to return for reevaluation.  Final Clinical Impressions(s) / UC Diagnoses   Final diagnoses:  Exposure to gonorrhea     Discharge Instructions      We treated you for gonorrhea again today.  Please abstain from sex for a minimum of 7 days.  All partners need to be tested and treated as well and wait 7 days after the treatment before engaging in any sexual activity.  If you develop any pelvic pain, abdominal pain, fever, nausea, vomiting you need to be seen immediately.    ED Prescriptions   None    PDMP not reviewed this encounter.   Jeani Hawking, PA-C 01/15/23 2007

## 2023-01-15 NOTE — Discharge Instructions (Signed)
We treated you for gonorrhea again today.  Please abstain from sex for a minimum of 7 days.  All partners need to be tested and treated as well and wait 7 days after the treatment before engaging in any sexual activity.  If you develop any pelvic pain, abdominal pain, fever, nausea, vomiting you need to be seen immediately.

## 2023-02-28 ENCOUNTER — Ambulatory Visit
Admission: EM | Admit: 2023-02-28 | Discharge: 2023-02-28 | Disposition: A | Discharge status: Home / Self Care | Payer: Medicaid Other | Attending: Internal Medicine | Admitting: Internal Medicine

## 2023-02-28 DIAGNOSIS — Z113 Encounter for screening for infections with a predominantly sexual mode of transmission: Secondary | ICD-10-CM | POA: Diagnosis not present

## 2023-02-28 NOTE — ED Provider Notes (Signed)
[facility]  Note:  This document was prepared using Conservation officer, historic buildings and may include unintentional dictation errors.  MRN: [id number] DOB: 07-Jan-1996  Subjective:   Melody Patrick is a 27 y.o. female presenting for recheck for gonorrhea.  Patient tested positive at the end of July, underwent treatment.  Would like to confirm that she is clear of the infection.  Denies fever, n/v, abdominal pain, pelvic pain, rashes, dysuria, urinary frequency, hematuria, vaginal discharge.    No current facility-administered medications for this encounter.  Current Outpatient Medications:    medroxyPROGESTERone (PROVERA) 10 MG tablet, Take 1 tablet (10 mg total) by mouth daily for 7 days. Q90 days without spontaneous menses, Disp: 28 tablet, Rfl: 0   metroNIDAZOLE (FLAGYL) 500 MG tablet, Take 1 tablet (500 mg total) by mouth 2 (two) times daily., Disp: 14 tablet, Rfl: 0   triamcinolone cream (KENALOG) 0.1 %, Apply 1 Application topically 2 (two) times daily., Disp: 30 g, Rfl: 0   No Known Allergies  Past Medical History:  Diagnosis Date   Eczema    IBS (irritable bowel syndrome)      Past Surgical History:  Procedure Laterality Date   WISDOM TOOTH EXTRACTION      Family History  Problem Relation Age of Onset   Healthy Mother    Breast cancer Maternal Aunt        40s/50s   Breast cancer Maternal Aunt        40s/50s   Ovarian cancer Maternal Aunt        40s/50s   Hypertension Maternal Grandmother     Social History   Tobacco Use   Smoking status: Every Day    Types: Cigars   Smokeless tobacco: Never  Vaping Use   Vaping status: Former  Substance Use Topics   Alcohol use: Yes    Comment: occasionally   Drug use: Yes    Types: Marijuana    ROS   Objective:   Vitals: BP 119/80 (BP Location: Left Arm)   Pulse 71   Temp 98.5 F (36.9 C)   Resp 16   LMP 02/19/2023   SpO2 97%   Physical Exam Constitutional:      General:  She is not in acute distress.    Appearance: Normal appearance. She is well-developed. She is not ill-appearing, toxic-appearing or diaphoretic.  HENT:     Head: Normocephalic and atraumatic.     Nose: Nose normal.     Mouth/Throat:     Mouth: Mucous membranes are moist.  Eyes:     General: No scleral icterus.       Right eye: No discharge.        Left eye: No discharge.     Extraocular Movements: Extraocular movements intact.  Cardiovascular:     Rate and Rhythm: Normal rate.  Pulmonary:     Effort: Pulmonary effort is normal.  Skin:    General: Skin is warm and dry.  Neurological:     General: No focal deficit present.     Mental Status: She is alert and oriented to person, place, and time.  Psychiatric:        Mood and Affect: Mood normal.        Behavior: Behavior normal.     Assessment and Plan :   PDMP not reviewed this encounter.  1. Screen for STD (sexually transmitted disease)    Testing pending, will treat as appropriate.   Wallis Bamberg, PA-C 02/28/23  33

## 2023-02-28 NOTE — ED Triage Notes (Signed)
Pt states she was +gonorrhea recently-requesting STD testing-NAD-steady gait

## 2023-03-03 LAB — CERVICOVAGINAL ANCILLARY ONLY
Chlamydia: NEGATIVE
Comment: NEGATIVE
Comment: NEGATIVE
Comment: NORMAL
Neisseria Gonorrhea: NEGATIVE
Trichomonas: NEGATIVE

## 2023-03-13 ENCOUNTER — Telehealth: Payer: Medicaid Other | Admitting: Family Medicine

## 2023-03-13 DIAGNOSIS — Z872 Personal history of diseases of the skin and subcutaneous tissue: Secondary | ICD-10-CM | POA: Diagnosis not present

## 2023-03-13 DIAGNOSIS — L309 Dermatitis, unspecified: Secondary | ICD-10-CM

## 2023-03-14 MED ORDER — TRIAMCINOLONE ACETONIDE 0.1 % EX CREA: 1.0000 | TOPICAL_CREAM | Freq: Two times a day (BID) | CUTANEOUS | 0 refills | Status: AC

## 2023-03-14 NOTE — Progress Notes (Signed)
E-Visit for Eczema  We are sorry that you are not feeling well. Here is how we plan to help! Based on what you shared with me it looks like you have eczema (atopic dermatitis).  Although the cause of eczema is not completely understood, genetics appear to play a strong role, and people with a family history of eczema are at increased risk of developing the condition. In most people with eczema, there is a genetic abnormality in the outermost layer of the skin, called the epidermis   Most people with eczema develop their first symptoms as children, before the age of 81. Intense itching of the skin, patches of redness, small bumps, and skin flaking are common. Scratching can further inflame the skin and worsen the itching. The itchiness may be more noticeable at nighttime.  Eczema commonly affects the back of the neck, the elbow creases, and the backs of the knees. Other affected areas may include the face, wrists, and forearms. The skin may become thickened and darkened, or even scarred, from repeated scratching. Eliminating factors that aggravate your eczema symptoms can help to control the symptoms. Possible triggers may include: ? Cold or dry environments ? Sweating ? Emotional stress or anxiety ? Rapid temperature changes ? Exposure to certain chemicals or cleaning solutions, including soaps and detergents, perfumes and cosmetics, wool or synthetic fibers, dust, sand, and cigarette smoke Keeping your skin hydrated Emollients -- Emollients are creams and ointments that moisturize the skin and prevent it from drying out. The best emollients for people with eczema are thick creams (such as Eucerin, Cetaphil, and Nutraderm) or ointments (such as petroleum jelly, Aquaphor, and Vaseline), which contain little to no water. Emollients are most effective when applied immediately after bathing. Emollients can be applied twice a day or more often if needed. Lotions contain more water than creams and  ointments and are less effective for moisturizing the skin. Bathing -- It is not clear if showers or baths are better for keeping the skin hydrated. Lukewarm baths or showers can hydrate and cool the skin, temporarily relieving itching from eczema. An unscented, mild soap or non-soap cleanser (such as Cetaphil) should be used sparingly. Apply an emollient immediately after bathing or showering to prevent your skin from drying out as a result of water evaporation. Emollient bath additives (products you add to the bath water) have not been found to help relieve symptoms. Hot or long baths (more than 10 to 15 minutes) and showers should be avoided since they can dry out the skin.  Based on what you shared with me you may have eczema.   Triamcinalone ointment (or cream). Apply to the effected areas twice per day.  Based on what you shared with me you may have a skin infection.      I recommend dilute bleach baths for people with eczema. These baths help to decrease the number of bacteria on the skin that can cause infections or worsen symptoms. To prepare a bleach bath, one-fourth to one-half cup of bleach is placed in a full bathtub (about 40 gallons) of water. Bleach baths are usually taken for 5 to 10 minutes twice per week and should be followed by application of an emollient (listed above). I recommend you take Benadryl 25mg  - 50mg  every 4 hours to control the symptoms (including itching) but if they last over 24 hours it is best that you see an office based provider for follow up.  HOME CARE: Take lukewarm showers or baths Apply creams and  ointments to prevent the skin from drying (Eucerin, Cetaphil, Nutraderm, petroleum jelly, Aquaphor or Vaseline) - these products contain less water than other lotions and are more effective for moisturizing the skin Limit exposure to cold or dry environments, sweating, emotional stress and anxiety, rapid temperature changes and exposure to chemicals and  cleaning products, soaps and detergents, perfumes, cosmetics, wool and synthetic fibers, dust, sand and cigarette- factors which can aggravate eczema symptoms.  Use a hydrocortisone cream once or twice a day Take an antihistamine like Benadryl for widespread rashes that itch.  The adult dosage of Benadryl is 25-50 mg by mouth 4 times daily. Caution: This type of medication may cause sleepiness.  Do not drink alcohol, drive, or operate dangerous machinery while taking antihistamines.  Do not take these medications if you have prostate enlargement.  Read the package instructions thoroughly on all medications that you take.  GET HELP RIGHT AWAY IF: Symptoms that don't go away after treatment. Severe itching that persists. You develop a fever. Your skin begins to drain. You have a sore throat. You become short of breath.  MAKE SURE YOU   Understand these instructions. Will watch your condition. Will get help right away if you are not doing well or get worse.    Thank you for choosing an e-visit.  Your e-visit answers were reviewed by a board certified advanced clinical practitioner to complete your personal care plan. Depending upon the condition, your plan could have included both over the counter or prescription medications.  Please review your pharmacy choice. Make sure the pharmacy is open so you can pick up prescription now. If there is a problem, you may contact your provider through [employer] and have the prescription routed to another pharmacy.  Your safety is important to [place]. If you have drug allergies check your prescription carefully.   For the next 24 hours you can use MyChart to ask questions about today's visit, request a non-urgent call back, or ask for a work or school excuse. You will get an email in the next two days asking about your experience. I hope that your e-visit has been valuable and will speed your recovery.    have provided 5 minutes of non face to face  time during this encounter for chart review and documentation.

## 2023-04-02 ENCOUNTER — Ambulatory Visit
Admission: RE | Admit: 2023-04-02 | Discharge: 2023-04-02 | Disposition: A | Discharge status: Home / Self Care | Payer: Medicaid Other | Source: Ambulatory Visit | Attending: Internal Medicine | Admitting: Internal Medicine

## 2023-04-02 VITALS — BP 127/84 | HR 78 | Temp 98.7°F | Resp 16

## 2023-04-02 DIAGNOSIS — Z1152 Encounter for screening for COVID-19: Secondary | ICD-10-CM | POA: Insufficient documentation

## 2023-04-02 DIAGNOSIS — F17200 Nicotine dependence, unspecified, uncomplicated: Secondary | ICD-10-CM | POA: Insufficient documentation

## 2023-04-02 DIAGNOSIS — E282 Polycystic ovarian syndrome: Secondary | ICD-10-CM | POA: Diagnosis not present

## 2023-04-02 DIAGNOSIS — B349 Viral infection, unspecified: Secondary | ICD-10-CM | POA: Insufficient documentation

## 2023-04-02 DIAGNOSIS — N912 Amenorrhea, unspecified: Secondary | ICD-10-CM

## 2023-04-02 LAB — POCT URINE PREGNANCY: Preg Test, Ur: NEGATIVE

## 2023-04-02 MED ORDER — PREDNISONE 10 MG PO TABS: 30.0000 mg | ORAL_TABLET | Freq: Every day | ORAL | 0 refills | Status: DC

## 2023-04-02 MED ORDER — CETIRIZINE HCL 10 MG PO TABS: 10.0000 mg | ORAL_TABLET | Freq: Every day | ORAL | 0 refills | Status: DC

## 2023-04-02 MED ORDER — PROMETHAZINE-DM 6.25-15 MG/5ML PO SYRP: 5.0000 mL | ORAL_SOLUTION | Freq: Three times a day (TID) | ORAL | 0 refills | Status: DC | PRN

## 2023-04-02 NOTE — ED Triage Notes (Signed)
Pt c/o dry cough day/chest pain with cough-denies runny nose/congestion as noted in appt info-NAD-steady gait

## 2023-04-02 NOTE — ED Provider Notes (Addendum)
[facility]  Note:  This document was prepared using Conservation officer, historic buildings and may include unintentional dictation errors.  MRN: [id number] DOB: May 01, 1996  Subjective:   Melody Patrick is a 27 y.o. female presenting for 3-day history of acute onset persistent runny and stuffy nose, sore throat, drainage, productive cough, coughing fits that elicit chest pain, body pains.  Has also started to have right ear discomfort, itching and irritation.  LMP is 10 days late.  No current facility-administered medications for this encounter. No current outpatient medications on file.   No Known Allergies  Past Medical History:  Diagnosis Date   Eczema    IBS (irritable bowel syndrome)      Past Surgical History:  Procedure Laterality Date   WISDOM TOOTH EXTRACTION      Family History  Problem Relation Age of Onset   Healthy Mother    Breast cancer Maternal Aunt        40s/50s   Breast cancer Maternal Aunt        40s/50s   Ovarian cancer Maternal Aunt        40s/50s   Hypertension Maternal Grandmother     Social History   Tobacco Use   Smoking status: Every Day    Types: Cigars   Smokeless tobacco: Never  Vaping Use   Vaping status: Former  Substance Use Topics   Alcohol use: Yes    Comment: occasionally   Drug use: Yes    Types: Marijuana    ROS   Objective:   Vitals: BP 127/84 (BP Location: Right Arm)   Pulse 78   Temp 98.7 F (37.1 C) (Oral)   Resp 16   LMP  (LMP Unknown) Comment: hx PCOS  SpO2 98%   Physical Exam Constitutional:      General: She is not in acute distress.    Appearance: Normal appearance. She is well-developed and normal weight. She is not ill-appearing, toxic-appearing or diaphoretic.  HENT:     Head: Normocephalic and atraumatic.     Right Ear: Ear canal and external ear normal. No drainage or tenderness. No middle ear effusion. There is no impacted cerumen. Tympanic membrane is not  erythematous or bulging.     Left Ear: Tympanic membrane, ear canal and external ear normal. No drainage or tenderness.  No middle ear effusion. There is no impacted cerumen. Tympanic membrane is not erythematous or bulging.     Ears:     Comments: Air-fluid level of the right TM.  No erythema, bulging.    Nose: Nose normal. No congestion or rhinorrhea.     Mouth/Throat:     Mouth: Mucous membranes are moist. No oral lesions.     Pharynx: No pharyngeal swelling, oropharyngeal exudate, posterior oropharyngeal erythema or uvula swelling.     Tonsils: No tonsillar exudate or tonsillar abscesses.  Eyes:     General: No scleral icterus.       Right eye: No discharge.        Left eye: No discharge.     Extraocular Movements: Extraocular movements intact.     Right eye: Normal extraocular motion.     Left eye: Normal extraocular motion.     Conjunctiva/sclera: Conjunctivae normal.  Cardiovascular:     Rate and Rhythm: Normal rate and regular rhythm.     Heart sounds: Normal heart sounds. No murmur heard.    No friction rub. No gallop.  Pulmonary:     Effort: Pulmonary effort  is normal. No respiratory distress.     Breath sounds: No stridor. No wheezing, rhonchi or rales.  Chest:     Chest wall: No tenderness.  Musculoskeletal:     Cervical back: Normal range of motion and neck supple.  Lymphadenopathy:     Cervical: No cervical adenopathy.  Skin:    General: Skin is warm and dry.  Neurological:     General: No focal deficit present.     Mental Status: She is alert and oriented to person, place, and time.  Psychiatric:        Mood and Affect: Mood normal.        Behavior: Behavior normal.     Results for orders placed or performed during the hospital encounter of 04/02/23 (from the past 24 hour(s))  POCT urine pregnancy     Status: None   Collection Time: 04/02/23  6:12 PM  Result Value Ref Range   Preg Test, Ur Negative Negative    Assessment and Plan :   PDMP not reviewed  this encounter.  1. Acute viral syndrome   2. Smoker   3. Amenorrhea   4. PCOS (polycystic ovarian syndrome)     Will manage for viral illness such as viral URI, viral syndrome, viral rhinitis, COVID-19.  In the context of her smoking, respiratory symptoms, recommended an oral prednisone course.  Otherwise, recommended supportive care. Offered scripts for symptomatic relief. Testing is pending. Counseled patient on potential for adverse effects with medications prescribed/recommended today, ER and return-to-clinic precautions discussed, patient verbalized understanding.      Wallis Bamberg, [place] 04/02/23 [id number]

## 2023-04-02 NOTE — Discharge Instructions (Signed)
We will notify you of your test results as they arrive and may take between about 24 hours.  I encourage you to sign up for MyChart if you have not already done so as this can be the easiest way for [place] to communicate results to you online or through a phone app.  Generally, we only contact you if it is a positive test result.  In the meantime, if you develop worsening symptoms including fever, chest pain, shortness of breath despite our current treatment plan then please report to the emergency room as this may be a sign of worsening status from possible viral infection.  Otherwise, we will manage this as a viral respiratory infection/viral syndrome. For sore throat or cough try using a honey-based tea. Use 3 teaspoons of honey with juice squeezed from half lemon. Place shaved pieces of ginger into 1/2-1 cup of water and warm over stove top. Then mix the ingredients and repeat every 4 hours as needed. Please take Tylenol 500mg -650mg  every 6 hours for aches and pains, fevers. Hydrate very well with at least 2 liters of water. Eat light meals such as soups to replenish electrolytes and soft fruits, veggies. Start an antihistamine like Zyrtec (10mg  daily) for postnasal drainage, sinus congestion.  You can take this together with prednisone for the same kind of congestion.  Use the cough medications as needed.

## 2023-04-03 LAB — SARS CORONAVIRUS 2 (TAT 6-24 HRS): SARS Coronavirus 2: NEGATIVE

## 2023-04-04 ENCOUNTER — Encounter: Payer: Self-pay | Admitting: Obstetrics and Gynecology

## 2023-05-06 ENCOUNTER — Ambulatory Visit: Payer: Medicaid Other

## 2023-05-08 ENCOUNTER — Ambulatory Visit: Payer: Medicaid Other

## 2023-05-11 ENCOUNTER — Ambulatory Visit: Payer: Medicaid Other

## 2023-05-12 ENCOUNTER — Ambulatory Visit
Admission: RE | Admit: 2023-05-12 | Discharge: 2023-05-12 | Disposition: A | Discharge status: Home / Self Care | Payer: Medicaid Other | Source: Ambulatory Visit | Attending: Internal Medicine | Admitting: Internal Medicine

## 2023-05-12 VITALS — BP 121/84 | HR 85 | Temp 98.3°F

## 2023-05-12 DIAGNOSIS — N3001 Acute cystitis with hematuria: Secondary | ICD-10-CM | POA: Diagnosis not present

## 2023-05-12 DIAGNOSIS — Z9189 Other specified personal risk factors, not elsewhere classified: Secondary | ICD-10-CM | POA: Diagnosis not present

## 2023-05-12 DIAGNOSIS — Z3202 Encounter for pregnancy test, result negative: Secondary | ICD-10-CM | POA: Insufficient documentation

## 2023-05-12 LAB — POCT URINALYSIS DIP (MANUAL ENTRY)
Bilirubin, UA: NEGATIVE
Glucose, UA: NEGATIVE mg/dL
Ketones, POC UA: NEGATIVE mg/dL
Nitrite, UA: POSITIVE — AB
Protein Ur, POC: 100 mg/dL — AB
Spec Grav, UA: 1.02 (ref 1.010–1.025)
Urobilinogen, UA: 0.2 U/dL
pH, UA: 6.5 (ref 5.0–8.0)

## 2023-05-12 LAB — POCT URINE PREGNANCY: Preg Test, Ur: NEGATIVE

## 2023-05-12 MED ORDER — CEPHALEXIN 500 MG PO CAPS: 500.0000 mg | ORAL_CAPSULE | Freq: Two times a day (BID) | ORAL | 0 refills | Status: AC

## 2023-05-12 NOTE — ED Provider Notes (Addendum)
[facility]    CSN: [id number] Arrival date & time: 05/12/23  1555      History   Chief Complaint Chief Complaint  Patient presents with   Exposure to STD    My urine smells weird want to get tested for all stds/sti even for hiv/sphyillis want to test for everything - Entered by patient    HPI Melody Patrick is a 27 y.o. female.   Melody Patrick is a 27 y.o. female presenting for chief complaint of strong smelling urine that she first noticed 1 week ago. She developed cramping to the bilateral lower pelvic region while sitting at urgent care. Reports intermittent urinary urgency for the last 4-5 days as well. Denies dysuria, urinary frequency, nausea, vomiting, flank pain, diarrhea, back pain, dizziness, fevers/chills. Denies vaginal discharge, odor, itch, and rash. LMP 10/29/20204. Sexually active unprotected with 2 female partners recently, unsure of any exposures to STDs recently. History of PCOS, unsure of chance of pregnancy. She has not attempted use of any OTC medicines to help with symptoms.   Exposure to STD    Past Medical History:  Diagnosis Date   Eczema    IBS (irritable bowel syndrome)     Patient Active Problem List   Diagnosis Date Noted   Family history of breast cancer 08/20/2022   On Depo-Provera for contraception 10/09/2016    Past Surgical History:  Procedure Laterality Date   WISDOM TOOTH EXTRACTION      OB History     Gravida  2   Para  1   Term  1   Preterm      AB  1   Living  1      SAB  1   IAB      Ectopic      Multiple  0   Live Births  1            Home Medications    Prior to Admission medications   Medication Sig Start Date End Date Taking? Authorizing Provider  cephALEXin (KEFLEX) 500 MG capsule Take 1 capsule (500 mg total) by mouth 2 (two) times daily for 7 days. 05/12/23 05/19/23 Yes StanhopeDonavan Burnet, FNP  cetirizine (ZYRTEC ALLERGY) 10 MG tablet Take 1 tablet (10 mg total) by  mouth daily. 04/02/23   Wallis Bamberg, PA-C  predniSONE (DELTASONE) 10 MG tablet Take 3 tablets (30 mg total) by mouth daily with breakfast. 04/02/23   Wallis Bamberg, PA-C  promethazine-dextromethorphan (PROMETHAZINE-DM) 6.25-15 MG/5ML syrup Take 5 mLs by mouth 3 (three) times daily as needed for cough. 04/02/23   Wallis Bamberg, PA-C    Family History Family History  Problem Relation Age of Onset   Healthy Mother    Breast cancer Maternal Aunt        40s/50s   Breast cancer Maternal Aunt        40s/50s   Ovarian cancer Maternal Aunt        40s/50s   Hypertension Maternal Grandmother     Social History Social History   Tobacco Use   Smoking status: Every Day    Types: Cigars   Smokeless tobacco: Never  Vaping Use   Vaping status: Former  Substance Use Topics   Alcohol use: Yes    Comment: occasionally   Drug use: Yes    Types: Marijuana     Allergies   Patient has no known allergies.   Review of Systems Review of Systems Per HPI  Physical Exam  Triage Vital Signs ED Triage Vitals  Encounter Vitals Group     BP 05/12/23 1610 121/84     Systolic BP Percentile --      Diastolic BP Percentile --      Pulse Rate 05/12/23 1610 85     Resp --      Temp 05/12/23 1610 98.3 F (36.8 C)     Temp Source 05/12/23 1610 Oral     SpO2 05/12/23 1610 96 %     Weight --      Height --      Head Circumference --      Peak Flow --      Pain Score 05/12/23 1609 0     Pain Loc --      Pain Education --      Exclude from Growth Chart --    No data found.  Updated Vital Signs BP 121/84 (BP Location: Right Arm)   Pulse 85   Temp 98.3 F (36.8 C) (Oral)   LMP 04/15/2023 (Approximate)   SpO2 96%   Visual Acuity Right Eye Distance:   Left Eye Distance:   Bilateral Distance:    Right Eye Near:   Left Eye Near:    Bilateral Near:     Physical Exam Vitals and nursing note reviewed.  Constitutional:      Appearance: She is not ill-appearing or toxic-appearing.  HENT:      Head: Normocephalic and atraumatic.     Right Ear: Hearing and external ear normal.     Left Ear: Hearing and external ear normal.     Nose: Nose normal.     Mouth/Throat:     Lips: Pink.  Eyes:     General: Lids are normal. Vision grossly intact. Gaze aligned appropriately.     Extraocular Movements: Extraocular movements intact.     Conjunctiva/sclera: Conjunctivae normal.  Pulmonary:     Effort: Pulmonary effort is normal.  Abdominal:     General: Bowel sounds are normal.     Palpations: Abdomen is soft.     Tenderness: There is no abdominal tenderness. There is no right CVA tenderness, left CVA tenderness or guarding.  Musculoskeletal:     Cervical back: Neck supple.  Skin:    General: Skin is warm and dry.     Capillary Refill: Capillary refill takes less than 2 seconds.     Findings: No rash.  Neurological:     General: No focal deficit present.     Mental Status: She is alert and oriented to person, place, and time. Mental status is at baseline.     Cranial Nerves: No dysarthria or facial asymmetry.  Psychiatric:        Mood and Affect: Mood normal.        Speech: Speech normal.        Behavior: Behavior normal.        Thought Content: Thought content normal.        Judgment: Judgment normal.      UC Treatments / Results  Labs (all labs ordered are listed, but only abnormal results are displayed) Labs Reviewed  POCT URINALYSIS DIP (MANUAL ENTRY) - Abnormal; Notable for the following components:      Result Value   Clarity, UA cloudy (*)    Blood, UA moderate (*)    Protein Ur, POC =100 (*)    Nitrite, UA Positive (*)    Leukocytes, UA Large (3+) (*)    All other components within  normal limits  URINE CULTURE  HIV ANTIBODY (ROUTINE TESTING W REFLEX)  RPR  POCT URINE PREGNANCY  CERVICOVAGINAL ANCILLARY ONLY    EKG   Radiology No results found.  Procedures Procedures (including critical care time)  Medications Ordered in UC Medications - No data  to display  Initial Impression / Assessment and Plan / UC Course  I have reviewed the triage vital signs and the nursing notes.  Pertinent labs & imaging results that were available during my care of the patient were reviewed by me and considered in my medical decision making (see chart for details).   1. At risk for STD due to unprotected sex STI labs pending, will notify patient of positive results and treat accordingly per protocol when labs result.  Deferred yeast and BV testing as she is not having symptoms. Patient would like HIV and syphilis testing today.   Patient to avoid sexual intercourse until screening testing comes back.   Education provided regarding safe sexual practices and patient encouraged to use protection to prevent spread of STIs.  Pregnancy test negative.  2. Acute cystitis with hematuria Presentation is consistent with acute uncomplicated cystitis. Patient is nontoxic in appearance with hemodynamically stable vital signs. Low suspicion for acute pyelonephritis. Low suspicion for kidney stone or infected stone. No indication for labs or imaging at this time. Keflex antibiotic sent to pharmacy.  Urine culture pending. Patient to push fluids to stay well hydrated and reduce intake of known urinary irritants.  Counseled patient on potential for adverse effects with medications prescribed/recommended today, strict ER and return-to-clinic precautions discussed, patient verbalized understanding.    Final Clinical Impressions(s) / UC Diagnoses   Final diagnoses:  At risk for sexually transmitted disease due to unprotected sex  Acute cystitis with hematuria  Negative pregnancy test     Discharge Instructions      Your urine shows you likely have a urinary tract infection.  I have sent your urine for culture to confirm this.  We will call you if we need to change your antibiotic when we find out the type of bacteria growing in your bladder.  Take antibiotic as  directed with a snack/food to avoid stomach upset. To avoid GI upset please take this medication with food.   Avoid drinking beverages that irritate the urinary tract like sodas, tea, coffee, or juice. Drink plenty of water to stay well hydrated and prevent severe infection.  STD testing pending, this will take 2-3 days to result. We will only call you if your testing is positive for any infection(s) and we will provide treatment.  Avoid sexual intercourse until your STD results come back.  If any of your STD results are positive, you will need to avoid sexual intercourse for 7 days while you are being treated to prevent spread of STD.  Condom use is the best way to prevent spread of STDs. Notify partner(s) of any positive results.  If you develop any new or worsening symptoms or if your symptoms do not start to improve, pleases return here or follow-up with your primary care provider. If your symptoms are severe, please go to the emergency room.     ED Prescriptions     Medication Sig Dispense Auth. Provider   cephALEXin (KEFLEX) 500 MG capsule Take 1 capsule (500 mg total) by mouth 2 (two) times daily for 7 days. 14 capsule Carlisle Beers, FNP      PDMP not reviewed this encounter.   Carlisle Beers, FNP  05/12/23 1646    Carlisle Beers, FNP 05/12/23 1647

## 2023-05-12 NOTE — Discharge Instructions (Addendum)
Your urine shows you likely have a urinary tract infection.  I have sent your urine for culture to confirm this.  We will call you if we need to change your antibiotic when we find out the type of bacteria growing in your bladder.  Take antibiotic as directed with a snack/food to avoid stomach upset. To avoid GI upset please take this medication with food.   Avoid drinking beverages that irritate the urinary tract like sodas, tea, coffee, or juice. Drink plenty of water to stay well hydrated and prevent severe infection.  STD testing pending, this will take 2-3 days to result. We will only call you if your testing is positive for any infection(s) and we will provide treatment.  Avoid sexual intercourse until your STD results come back.  If any of your STD results are positive, you will need to avoid sexual intercourse for 7 days while you are being treated to prevent spread of STD.  Condom use is the best way to prevent spread of STDs. Notify partner(s) of any positive results.  If you develop any new or worsening symptoms or if your symptoms do not start to improve, pleases return here or follow-up with your primary care provider. If your symptoms are severe, please go to the emergency room.

## 2023-05-12 NOTE — ED Triage Notes (Signed)
Pt presents to U/C with c/o last week when she used the bathroom her urine had a bad smell and she has white discharge. Pt reports left flank pain started this morning. Pt would like to get STD testing also just to make sure.

## 2023-05-13 ENCOUNTER — Telehealth: Payer: Medicaid Other | Admitting: Family Medicine

## 2023-05-13 DIAGNOSIS — M545 Low back pain, unspecified: Secondary | ICD-10-CM

## 2023-05-13 DIAGNOSIS — N3001 Acute cystitis with hematuria: Secondary | ICD-10-CM

## 2023-05-13 LAB — CERVICOVAGINAL ANCILLARY ONLY
Chlamydia: NEGATIVE
Comment: NEGATIVE
Comment: NEGATIVE
Comment: NORMAL
Neisseria Gonorrhea: NEGATIVE
Trichomonas: NEGATIVE

## 2023-05-13 LAB — RPR: RPR Ser Ql: NONREACTIVE

## 2023-05-13 LAB — HIV ANTIBODY (ROUTINE TESTING W REFLEX): HIV Screen 4th Generation wRfx: NONREACTIVE

## 2023-05-13 NOTE — Progress Notes (Signed)
Because UTI with severe back pain could me the infection is moving up your condition warrants further evaluation and I recommend that you be seen in a face to face visit at your PCP or local Urgent Care for testing.    NOTE: There will be NO CHARGE for this eVisit   If you are having a true medical emergency please call 911.

## 2023-05-14 ENCOUNTER — Ambulatory Visit
Admission: RE | Admit: 2023-05-14 | Discharge: 2023-05-14 | Disposition: A | Discharge status: Home / Self Care | Payer: Medicaid Other | Source: Ambulatory Visit | Attending: Internal Medicine | Admitting: Internal Medicine

## 2023-05-14 VITALS — BP 109/73 | HR 73 | Temp 97.8°F | Resp 16

## 2023-05-14 DIAGNOSIS — S39012A Strain of muscle, fascia and tendon of lower back, initial encounter: Secondary | ICD-10-CM

## 2023-05-14 DIAGNOSIS — M545 Low back pain, unspecified: Secondary | ICD-10-CM | POA: Diagnosis not present

## 2023-05-14 DIAGNOSIS — L309 Dermatitis, unspecified: Secondary | ICD-10-CM | POA: Diagnosis not present

## 2023-05-14 LAB — URINE CULTURE: Culture: >=100000 — AB

## 2023-05-14 MED ORDER — NAPROXEN 375 MG PO TABS
375.0000 mg | ORAL_TABLET | Freq: Two times a day (BID) | ORAL | 0 refills | Status: AC
Start: 2023-05-14 — End: ?

## 2023-05-14 MED ORDER — TRIAMCINOLONE ACETONIDE 0.1 % EX CREA: 1.0000 | TOPICAL_CREAM | Freq: Two times a day (BID) | CUTANEOUS | 0 refills | Status: DC

## 2023-05-14 MED ORDER — CYCLOBENZAPRINE HCL 5 MG PO TABS: 5.0000 mg | ORAL_TABLET | Freq: Every evening | ORAL | 0 refills | Status: AC | PRN

## 2023-05-14 NOTE — ED Provider Notes (Signed)
[facility]  Note:  This document was prepared using Conservation officer, historic buildings and may include unintentional dictation errors.  MRN: [id number] DOB: 11-Dec-1995  Subjective:   Melody Patrick is a 27 y.o. female presenting for 2-day history of persistent left lower back pain.  Was seen and got started on antibiotics appropriately for acute cystitis.  Her urinary symptoms have improved significantly.  However, she continues to have low back pain.  She does work as a Lawyer, has to lift and manipulate patients. Denies fever, n/v, abdominal pain, pelvic pain, rashes, dysuria, urinary frequency, hematuria, vaginal discharge.  She is trying to hydrate better with 3 bottles of water daily now.  No fall, trauma, numbness or tingling, saddle paresthesia, radicular symptoms.  No history of renal colic.  No current facility-administered medications for this encounter.  Current Outpatient Medications:    cephALEXin (KEFLEX) 500 MG capsule, Take 1 capsule (500 mg total) by mouth 2 (two) times daily for 7 days., Disp: 14 capsule, Rfl: 0   cetirizine (ZYRTEC ALLERGY) 10 MG tablet, Take 1 tablet (10 mg total) by mouth daily., Disp: 30 tablet, Rfl: 0   predniSONE (DELTASONE) 10 MG tablet, Take 3 tablets (30 mg total) by mouth daily with breakfast., Disp: 15 tablet, Rfl: 0   promethazine-dextromethorphan (PROMETHAZINE-DM) 6.25-15 MG/5ML syrup, Take 5 mLs by mouth 3 (three) times daily as needed for cough., Disp: 200 mL, Rfl: 0   No Known Allergies  Past Medical History:  Diagnosis Date   Eczema    IBS (irritable bowel syndrome)      Past Surgical History:  Procedure Laterality Date   WISDOM TOOTH EXTRACTION      Family History  Problem Relation Age of Onset   Healthy Mother    Breast cancer Maternal Aunt        40s/50s   Breast cancer Maternal Aunt        40s/50s   Ovarian cancer Maternal Aunt        40s/50s   Hypertension Maternal Grandmother     Social  History   Tobacco Use   Smoking status: Every Day    Types: Cigars   Smokeless tobacco: Never  Vaping Use   Vaping status: Former  Substance Use Topics   Alcohol use: Yes    Comment: occasionally   Drug use: Yes    Types: Marijuana    ROS   Objective:   Vitals: BP 109/73 (BP Location: Right Arm)   Pulse 73   Temp 97.8 F (36.6 C) (Oral)   Resp 16   LMP 04/15/2023 (Approximate)   SpO2 96%   Physical Exam Constitutional:      General: She is not in acute distress.    Appearance: Normal appearance. She is well-developed. She is not ill-appearing, toxic-appearing or diaphoretic.  HENT:     Head: Normocephalic and atraumatic.     Nose: Nose normal.     Mouth/Throat:     Mouth: Mucous membranes are moist.     Pharynx: Oropharynx is clear.  Eyes:     General: No scleral icterus.       Right eye: No discharge.        Left eye: No discharge.     Extraocular Movements: Extraocular movements intact.     Conjunctiva/sclera: Conjunctivae normal.  Cardiovascular:     Rate and Rhythm: Normal rate.  Pulmonary:     Effort: Pulmonary effort is normal.  Abdominal:     General: Bowel  sounds are normal. There is no distension.     Palpations: Abdomen is soft. There is no mass.     Tenderness: There is no abdominal tenderness. There is no right CVA tenderness, left CVA tenderness, guarding or rebound.  Musculoskeletal:     Lumbar back: Tenderness (mild over area outlined) present. No swelling, edema, deformity, signs of trauma, lacerations, spasms or bony tenderness. Normal range of motion. Negative right straight leg raise test and negative left straight leg raise test. No scoliosis.       Back:  Skin:    General: Skin is warm and dry.  Neurological:     General: No focal deficit present.     Mental Status: She is alert and oriented to person, place, and time.  Psychiatric:        Mood and Affect: Mood normal.        Behavior: Behavior normal.        Thought Content:  Thought content normal.        Judgment: Judgment normal.     Results for orders placed or performed during the hospital encounter of 05/12/23 (from the past 72 hour(s))  POCT urine pregnancy     Status: None   Collection Time: 05/12/23  4:25 PM  Result Value Ref Range   Preg Test, Ur Negative Negative  POCT urinalysis dipstick     Status: Abnormal   Collection Time: 05/12/23  4:25 PM  Result Value Ref Range   Color, UA yellow yellow   Clarity, UA cloudy (A) clear   Glucose, UA negative negative mg/dL   Bilirubin, UA negative negative   Ketones, POC UA negative negative mg/dL   Spec Grav, UA 1.610 9.604 - 1.025   Blood, UA moderate (A) negative   pH, UA 6.5 5.0 - 8.0   Protein Ur, POC =100 (A) negative mg/dL   Urobilinogen, UA 0.2 0.2 or 1.0 E.U./dL   Nitrite, UA Positive (A) Negative   Leukocytes, UA Large (3+) (A) Negative  Cervicovaginal ancillary only     Status: None   Collection Time: 05/12/23  4:28 PM  Result Value Ref Range   Neisseria Gonorrhea Negative    Chlamydia Negative    Trichomonas Negative    Comment Normal Reference Ranger Chlamydia - Negative    Comment      Normal Reference Range Neisseria Gonorrhea - Negative   Comment Normal Reference Range Trichomonas - Negative   Urine Culture     Status: Abnormal   Collection Time: 05/12/23  4:30 PM   Specimen: Urine, Clean Catch  Result Value Ref Range   Specimen Description URINE, CLEAN CATCH    Special Requests      NONE Performed at [facility], [address], [place], [place] [address]    Culture >=100,000 COLONIES/mL ESCHERICHIA COLI (A)    Report Status 05/14/2023 FINAL    Organism ID, Bacteria ESCHERICHIA COLI (A)       Susceptibility   Escherichia coli - MIC*    AMPICILLIN <=2 SENSITIVE Sensitive     CEFAZOLIN <=4 SENSITIVE Sensitive     CEFEPIME <=0.12 SENSITIVE Sensitive     CEFTRIAXONE <=0.25 SENSITIVE Sensitive     CIPROFLOXACIN <=0.25 SENSITIVE Sensitive     GENTAMICIN <=1 SENSITIVE  Sensitive     IMIPENEM <=0.25 SENSITIVE Sensitive     NITROFURANTOIN <=16 SENSITIVE Sensitive     TRIMETH/SULFA <=20 SENSITIVE Sensitive     AMPICILLIN/SULBACTAM <=2 SENSITIVE Sensitive     PIP/TAZO <=4  SENSITIVE Sensitive ug/mL    * >=100,000 COLONIES/mL ESCHERICHIA COLI  HIV Antibody (routine testing w rflx)     Status: None   Collection Time: 05/12/23  4:41 PM  Result Value Ref Range   HIV Screen 4th Generation wRfx Non Reactive Non Reactive    Comment: HIV-1/HIV-2 antibodies and HIV-1 p24 antigen were NOT detected. There is no laboratory evidence of HIV infection. HIV Negative   RPR     Status: None   Collection Time: 05/12/23  4:41 PM  Result Value Ref Range   RPR Ser Ql Non Reactive Non Reactive     Assessment and Plan :   PDMP not reviewed this encounter.  1. Acute left-sided low back pain without sciatica   2. Lumbar strain, initial encounter   3. Eczema, unspecified type    Low suspicion for pyelonephritis, ongoing urinary tract infection mass effect.  Finish antibiotic course for UTI.  Will manage conservatively for back strain with NSAID and muscle relaxant, rest and modification of physical activity.  Anticipatory guidance provided.  Counseled patient on potential for adverse effects with medications prescribed/recommended today, ER and return-to-clinic precautions discussed, patient verbalized understanding.    Wallis Bamberg, PA-C 05/14/23 1328

## 2023-05-14 NOTE — ED Triage Notes (Signed)
Pt presents to U/c  with c/o back pain x 2 days pt recently seen for a UTI and was given antibiotics pt reports she is feeling better as far as the UTI symptoms but back pain has gotten worse only relieve is when she gets in the bath tub and sits.

## 2023-06-17 ENCOUNTER — Telehealth: Payer: Medicaid Other | Admitting: Nurse Practitioner

## 2023-06-17 DIAGNOSIS — L309 Dermatitis, unspecified: Secondary | ICD-10-CM | POA: Diagnosis not present

## 2023-06-17 MED ORDER — CETIRIZINE HCL 10 MG PO TABS
10.0000 mg | ORAL_TABLET | Freq: Every day | ORAL | 0 refills | Status: AC
Start: 2023-06-17 — End: ?

## 2023-06-17 MED ORDER — AQUAPHOR EX OINT
TOPICAL_OINTMENT | CUTANEOUS | 0 refills | Status: AC | PRN
Start: 2023-06-17 — End: ?

## 2023-06-17 MED ORDER — PREDNISONE 10 MG (21) PO TBPK
ORAL_TABLET | ORAL | 0 refills | Status: DC
Start: 2023-06-17 — End: 2023-07-09

## 2023-06-17 MED ORDER — TRIAMCINOLONE ACETONIDE 0.1 % EX CREA
1.0000 | TOPICAL_CREAM | Freq: Two times a day (BID) | CUTANEOUS | 0 refills | Status: AC
Start: 2023-06-17 — End: ?

## 2023-06-17 NOTE — Progress Notes (Signed)
 Virtual Visit Consent   IYSIS GERMAIN, you are scheduled for a virtual visit with a  provider today. Just as with appointments in the office, your consent must be obtained to participate. Your consent will be active for this visit and any virtual visit you may have with one of our providers in the next 365 days. If you have a MyChart account, a copy of this consent can be sent to you electronically.  As this is a virtual visit, video technology does not allow for your provider to perform a traditional examination. This may limit your provider's ability to fully assess your condition. If your provider identifies any concerns that need to be evaluated in person or the need to arrange testing (such as labs, EKG, etc.), we will make arrangements to do so. Although advances in technology are sophisticated, we cannot ensure that it will always work on either your end or our end. If the connection with a video visit is poor, the visit may have to be switched to a telephone visit. With either a video or telephone visit, we are not always able to ensure that we have a secure connection.  By engaging in this virtual visit, you consent to the provision of healthcare and authorize for your insurance to be billed (if applicable) for the services provided during this visit. Depending on your insurance coverage, you may receive a charge related to this service.  I need to obtain your verbal consent now. Are you willing to proceed with your visit today? ALYA SMALTZ has provided verbal consent on 06/17/2023 for a virtual visit (video or telephone). Lauraine Kitty, FNP  Date: 06/17/2023 5:15 PM  Virtual Visit via Video Note   I, Lauraine Kitty, connected with  JAPLEEN TORNOW  ([id number], [birth year]) on 06/17/23 at  5:15 PM EST by a video-enabled telemedicine application and verified that I am speaking with the correct person using two identifiers.  Location: Patient: Virtual Visit Location  Patient: Home Provider: Virtual Visit Location Provider: Home Office   I discussed the limitations of evaluation and management by telemedicine and the availability of in person appointments. The patient expressed understanding and agreed to proceed.    History of Present Illness: BEVERLIE KURIHARA is a 27 y.o. who identifies as a female who was assigned female at birth, and is being seen today for her eczema   She has been suffering from a recent flare on her legs, she has triamcinolone  cream 0.1% without relief  She has been using the cream on and off for a year    Right now the area of irritation is mainly behind her knees and extends onto her calves bilaterally    Problems:  Patient Active Problem List   Diagnosis Date Noted   Family history of breast cancer 08/20/2022   On Depo-Provera  for contraception 10/09/2016    Allergies: No Known Allergies Medications:  Current Outpatient Medications:    cetirizine  (ZYRTEC  ALLERGY) 10 MG tablet, Take 1 tablet (10 mg total) by mouth daily., Disp: 30 tablet, Rfl: 0   cyclobenzaprine  (FLEXERIL ) 5 MG tablet, Take 1 tablet (5 mg total) by mouth at bedtime as needed for muscle spasms., Disp: 30 tablet, Rfl: 0   naproxen  (NAPROSYN ) 375 MG tablet, Take 1 tablet (375 mg total) by mouth 2 (two) times daily with a meal., Disp: 30 tablet, Rfl: 0   predniSONE  (DELTASONE ) 10 MG tablet, Take 3 tablets (30 mg total) by mouth daily with breakfast., Disp: 15 tablet,  Rfl: 0   promethazine -dextromethorphan (PROMETHAZINE -DM) 6.25-15 MG/5ML syrup, Take 5 mLs by mouth 3 (three) times daily as needed for cough., Disp: 200 mL, Rfl: 0   triamcinolone  cream (KENALOG ) 0.1 %, Apply 1 Application topically 2 (two) times daily., Disp: 30 g, Rfl: 0  Observations/Objective: Patient is well-developed, well-nourished in no acute distress.  Resting comfortably  at home.  Head is normocephalic, atraumatic.  No labored breathing.  Speech is clear and coherent with logical  content.  Patient is alert and oriented at baseline.    Assessment and Plan:  1. Eczema, unspecified type (Primary)  Discussed limiting use of topical steroid to 14 days   Meds ordered this encounter  Medications   triamcinolone  cream (KENALOG ) 0.1 %    Sig: Apply 1 Application topically 2 (two) times daily.    Dispense:  30 g    Refill:  0   predniSONE  (STERAPRED UNI-PAK 21 TAB) 10 MG (21) TBPK tablet    Sig: Take 6 tablets on day one, 5 on day two, 4 on day three, 3 on day four, 2 on day five, and 1 on day six. Take with food.    Dispense:  21 tablet    Refill:  0   cetirizine  (ZYRTEC  ALLERGY) 10 MG tablet    Sig: Take 1 tablet (10 mg total) by mouth at bedtime.    Dispense:  30 tablet    Refill:  0   mineral oil-hydrophilic petrolatum (AQUAPHOR) ointment    Sig: Apply topically as needed for dry skin.    Dispense:  420 g    Refill:  0        Follow Up Instructions: I discussed the assessment and treatment plan with the patient. The patient was provided an opportunity to ask questions and all were answered. The patient agreed with the plan and demonstrated an understanding of the instructions.  A copy of instructions were sent to the patient via MyChart unless otherwise noted below.    The patient was advised to call back or seek an in-person evaluation if the symptoms worsen or if the condition fails to improve as anticipated.    Lauraine Kitty, FNP

## 2023-07-04 ENCOUNTER — Ambulatory Visit
Admission: RE | Admit: 2023-07-04 | Discharge: 2023-07-04 | Disposition: A | Discharge status: Home / Self Care | Payer: Medicaid Other | Source: Ambulatory Visit | Attending: Family Medicine

## 2023-07-04 VITALS — BP 110/75 | HR 70 | Temp 98.0°F | Resp 16

## 2023-07-04 DIAGNOSIS — R350 Frequency of micturition: Secondary | ICD-10-CM

## 2023-07-04 DIAGNOSIS — B351 Tinea unguium: Secondary | ICD-10-CM

## 2023-07-04 LAB — POCT URINALYSIS DIP (MANUAL ENTRY)
Bilirubin, UA: NEGATIVE
Blood, UA: NEGATIVE
Glucose, UA: NEGATIVE mg/dL
Ketones, POC UA: NEGATIVE mg/dL
Leukocytes, UA: NEGATIVE
Nitrite, UA: NEGATIVE
Protein Ur, POC: NEGATIVE mg/dL
Spec Grav, UA: 1.015 (ref 1.010–1.025)
Urobilinogen, UA: 0.2 U/dL
pH, UA: 6.5 (ref 5.0–8.0)

## 2023-07-04 LAB — POCT URINE PREGNANCY: Preg Test, Ur: NEGATIVE

## 2023-07-04 MED ORDER — FLUCONAZOLE 150 MG PO TABS: 150.0000 mg | ORAL_TABLET | ORAL | 0 refills | Status: DC

## 2023-07-04 NOTE — ED Triage Notes (Signed)
Pt c/o issues with the nail on the right and left 2nd toes-also c/o foul smelling urine and vaginal d/c-NAD-steady gait

## 2023-07-04 NOTE — ED Provider Notes (Signed)
[facility]  Note:  This document was prepared using Conservation officer, historic buildings and may include unintentional dictation errors.  MRN: [id number] DOB: 1996-01-19  Subjective:   Melody Patrick is a 28 y.o. female presenting for 2 primary concerns.  Reports vaginal discharge and strong smelling urine.  Had an urinary tract infection in November.  Took antibiotics to completion.  Has been hydrating very well.  Avoids urinary irritants since her last UTI.  Would like STI testing as well. Has 1 month history of persistent bilateral second toenail discomfort, nail deformity.  No pain, drainage of pus or bleeding.  No current facility-administered medications for this encounter.  Current Outpatient Medications:    cetirizine (ZYRTEC ALLERGY) 10 MG tablet, Take 1 tablet (10 mg total) by mouth daily., Disp: 30 tablet, Rfl: 0   cetirizine (ZYRTEC ALLERGY) 10 MG tablet, Take 1 tablet (10 mg total) by mouth at bedtime., Disp: 30 tablet, Rfl: 0   cyclobenzaprine (FLEXERIL) 5 MG tablet, Take 1 tablet (5 mg total) by mouth at bedtime as needed for muscle spasms., Disp: 30 tablet, Rfl: 0   mineral oil-hydrophilic petrolatum (AQUAPHOR) ointment, Apply topically as needed for dry skin., Disp: 420 g, Rfl: 0   naproxen (NAPROSYN) 375 MG tablet, Take 1 tablet (375 mg total) by mouth 2 (two) times daily with a meal., Disp: 30 tablet, Rfl: 0   predniSONE (DELTASONE) 10 MG tablet, Take 3 tablets (30 mg total) by mouth daily with breakfast., Disp: 15 tablet, Rfl: 0   predniSONE (STERAPRED UNI-PAK 21 TAB) 10 MG (21) TBPK tablet, Take 6 tablets on day one, 5 on day two, 4 on day three, 3 on day four, 2 on day five, and 1 on day six. Take with food., Disp: 21 tablet, Rfl: 0   promethazine-dextromethorphan (PROMETHAZINE-DM) 6.25-15 MG/5ML syrup, Take 5 mLs by mouth 3 (three) times daily as needed for cough., Disp: 200 mL, Rfl: 0   triamcinolone cream (KENALOG) 0.1 %, Apply 1  Application topically 2 (two) times daily., Disp: 30 g, Rfl: 0   triamcinolone cream (KENALOG) 0.1 %, Apply 1 Application topically 2 (two) times daily., Disp: 30 g, Rfl: 0   No Known Allergies  Past Medical History:  Diagnosis Date   Eczema    IBS (irritable bowel syndrome)      Past Surgical History:  Procedure Laterality Date   WISDOM TOOTH EXTRACTION      Family History  Problem Relation Age of Onset   Healthy Mother    Breast cancer Maternal Aunt        40s/50s   Breast cancer Maternal Aunt        40s/50s   Ovarian cancer Maternal Aunt        40s/50s   Hypertension Maternal Grandmother     Social History   Tobacco Use   Smoking status: Every Day    Types: Cigars   Smokeless tobacco: Never  Vaping Use   Vaping status: Former  Substance Use Topics   Alcohol use: Yes    Comment: occasionally   Drug use: Yes    Types: Marijuana    ROS   Objective:   Vitals: BP 110/75 (BP Location: Right Arm)   Pulse 70   Temp 98 F (36.7 C) (Oral)   Resp 16   LMP 05/28/2023   SpO2 96%   Physical Exam Constitutional:      General: She is not in acute distress.    Appearance: Normal appearance.  She is well-developed. She is not ill-appearing, toxic-appearing or diaphoretic.  HENT:     Head: Normocephalic and atraumatic.     Nose: Nose normal.     Mouth/Throat:     Mouth: Mucous membranes are moist.  Eyes:     General: No scleral icterus.       Right eye: No discharge.        Left eye: No discharge.     Extraocular Movements: Extraocular movements intact.     Conjunctiva/sclera: Conjunctivae normal.  Cardiovascular:     Rate and Rhythm: Normal rate.  Pulmonary:     Effort: Pulmonary effort is normal.  Abdominal:     General: Bowel sounds are normal. There is no distension.     Palpations: Abdomen is soft. There is no mass.     Tenderness: There is no abdominal tenderness. There is no right CVA tenderness, left CVA tenderness, guarding or rebound.  Skin:     General: Skin is warm and dry.     Comments: Hyperkeratotic bilateral second toenails with associated deformity.  Neurological:     General: No focal deficit present.     Mental Status: She is alert and oriented to person, place, and time.  Psychiatric:        Mood and Affect: Mood normal.        Behavior: Behavior normal.        Thought Content: Thought content normal.        Judgment: Judgment normal.     Results for orders placed or performed during the hospital encounter of 07/04/23 (from the past 24 hours)  POCT urine pregnancy     Status: None   Collection Time: 07/04/23 12:04 PM  Result Value Ref Range   Preg Test, Ur Negative Negative  POCT urinalysis dipstick     Status: Abnormal   Collection Time: 07/04/23 12:04 PM  Result Value Ref Range   Color, UA light yellow (A) yellow   Clarity, UA clear clear   Glucose, UA negative negative mg/dL   Bilirubin, UA negative negative   Ketones, POC UA negative negative mg/dL   Spec Grav, UA 2.130 8.657 - 1.025   Blood, UA negative negative   pH, UA 6.5 5.0 - 8.0   Protein Ur, POC negative negative mg/dL   Urobilinogen, UA 0.2 0.2 or 1.0 E.U./dL   Nitrite, UA Negative Negative   Leukocytes, UA Negative Negative    Assessment and Plan :   PDMP not reviewed this encounter.  1. Onychomycosis   2. Urinary frequency    Suspect onychomycosis, recommended fluconazole for 6 weeks once weekly.  Follow-up with podiatry, referral placed.  Recommend continued hydration.  Vaginal cytology and urine culture pending. Counseled patient on potential for adverse effects with medications prescribed/recommended today, ER and return-to-clinic precautions discussed, patient verbalized understanding.    Wallis Bamberg, [place] 07/04/23 1313

## 2023-07-04 NOTE — Discharge Instructions (Signed)
Please start fluconazole for suspected fungal infection of the toes, can also address vaginal yeast infection. Make sure you hydrate very well with plain water and a quantity of 80 ounces of water a day.  Please limit drinks that are considered urinary irritants such as soda, sweet tea, coffee, energy drinks, alcohol.  These can worsen your urinary and genital symptoms but also be the source of them.  I will let you know about your urine culture and vaginal swab results through MyChart to see if we need to prescribe or change your antibiotics based off of those results.  Follow up with podiatry for the toe nails.

## 2023-07-07 LAB — CERVICOVAGINAL ANCILLARY ONLY
Bacterial Vaginitis (gardnerella): NEGATIVE
Candida Glabrata: NEGATIVE
Candida Vaginitis: NEGATIVE
Chlamydia: NEGATIVE
Comment: NEGATIVE
Comment: NEGATIVE
Comment: NEGATIVE
Comment: NEGATIVE
Comment: NEGATIVE
Comment: NORMAL
Neisseria Gonorrhea: NEGATIVE
Trichomonas: POSITIVE — AB

## 2023-07-08 ENCOUNTER — Telehealth (HOSPITAL_COMMUNITY): Payer: Self-pay

## 2023-07-08 MED ORDER — METRONIDAZOLE 500 MG PO TABS: 500.0000 mg | ORAL_TABLET | Freq: Two times a day (BID) | ORAL | 0 refills | Status: AC

## 2023-07-08 NOTE — Telephone Encounter (Signed)
Per protocol, pt requires tx with metronidazole. Reviewed with patient, verified pharmacy, prescription sent.

## 2023-07-09 ENCOUNTER — Encounter: Payer: Self-pay | Admitting: Podiatrist

## 2023-07-09 ENCOUNTER — Ambulatory Visit: Payer: Medicaid Other | Admitting: Podiatrist

## 2023-07-09 DIAGNOSIS — L600 Ingrowing nail: Secondary | ICD-10-CM | POA: Diagnosis not present

## 2023-07-09 NOTE — Progress Notes (Signed)
Chief Complaint  Patient presents with   Nail Problem    Toenails bilateral - mainly 2nd bilateral - thick, discolored x few months   New Patient (Initial Visit)     HPI: Patient is 28 y.o. female who presents today for incurvation on the medial border of the second toe of bilateral feet.  The right second toe is the most symptomatic and uncomfortable.  She relates she noticed the nails curling in in the last 6 months.  Denies any trauma or injury to the toes.  Patient Active Problem List   Diagnosis Date Noted   Family history of breast cancer 08/20/2022    Current Outpatient Medications on File Prior to Visit  Medication Sig Dispense Refill   cetirizine (ZYRTEC ALLERGY) 10 MG tablet Take 1 tablet (10 mg total) by mouth at bedtime. 30 tablet 0   cyclobenzaprine (FLEXERIL) 5 MG tablet Take 1 tablet (5 mg total) by mouth at bedtime as needed for muscle spasms. 30 tablet 0   fluconazole (DIFLUCAN) 150 MG tablet Take 1 tablet (150 mg total) by mouth once a week. 6 tablet 0   metroNIDAZOLE (FLAGYL) 500 MG tablet Take 1 tablet (500 mg total) by mouth 2 (two) times daily for 7 days. 14 tablet 0   mineral oil-hydrophilic petrolatum (AQUAPHOR) ointment Apply topically as needed for dry skin. 420 g 0   naproxen (NAPROSYN) 375 MG tablet Take 1 tablet (375 mg total) by mouth 2 (two) times daily with a meal. 30 tablet 0   triamcinolone cream (KENALOG) 0.1 % Apply 1 Application topically 2 (two) times daily. 30 g 0   No current facility-administered medications on file prior to visit.    No Known Allergies  Review of Systems No fevers, chills, nausea, muscle aches, no difficulty breathing, no calf pain, no chest pain or shortness of breath.   Physical Exam  GENERAL APPEARANCE: Alert, conversant. Appropriately groomed. No acute distress.   VASCULAR: Pedal pulses palpable 2/4 DP and 2/4 PT bilateral.  Capillary refill time is immediate to all digits,  Proximal to distal cooling is warm to  warm.  Digital perfusion adequate.   NEUROLOGIC: sensation is intact to 5.07 monofilament at 5/5 sites bilateral.  Light touch is intact bilateral, vibratory sensation intact bilateral  MUSCULOSKELETAL: acceptable muscle strength, tone and stability bilateral.  No gross boney pedal deformities noted.  No pain, crepitus or limitation noted with foot and ankle range of motion bilateral.   DERMATOLOGIC: skin is warm, supple, and dry.  Color, texture, and turgor of skin within normal limits.   Bilateral second toenails of the pincer nail type deformity on the medial aspect of the nails.  Medial nail border curves underneath to the central aspect of the nail from distal to proximal.  Pain with direct compression is noted.  No redness, no swelling, no sign of infection is seen.   Assessment     ICD-10-CM   1. Ingrown nail of second toe of right foot  L60.0        Plan  Treatment options and alternatives were discussed. Recommended a permanent removal of the medial nail border of the right second toe-  discussed this would in effect make the nail thin on the medial side and this may not look better aesthetically-  she understands and wishes to proceed.   Consents signed.  Skin was prepped with alcohol and a local injection of lidocaine and Marcaine plain was infiltrated to anesthetize the toe. The toe was then prepped  with Betadine and exsanguinated. The offending medial nail border was removed and phenol applied to the exposed matrix tissue.  The area was then cleansed well with alcohol.  Antibiotic ointment and a dressing was then applied the tourniquet released  noting a prompt hyperemic response to the tip of the toe.  Oral and written instructions were dispensed and the patient was instructed on aftercare.  If there is any increased redness, swelling, drainage, pus or any other concerns arise, Hattye will call to be seen.    She would like to have this procedure done on the left second toe at  a later date- she will return in 2 weeks and is scheduled to possibly have the procedure done at this visit.

## 2023-07-09 NOTE — Patient Instructions (Signed)

## 2023-07-15 ENCOUNTER — Ambulatory Visit
Admission: RE | Admit: 2023-07-15 | Discharge: 2023-07-15 | Disposition: A | Discharge status: Home / Self Care | Payer: Medicaid Other | Source: Ambulatory Visit | Attending: Internal Medicine | Admitting: Internal Medicine

## 2023-07-15 VITALS — BP 124/85 | HR 95 | Temp 99.1°F | Resp 17

## 2023-07-15 DIAGNOSIS — F17210 Nicotine dependence, cigarettes, uncomplicated: Secondary | ICD-10-CM | POA: Diagnosis not present

## 2023-07-15 DIAGNOSIS — J069 Acute upper respiratory infection, unspecified: Secondary | ICD-10-CM | POA: Diagnosis not present

## 2023-07-15 DIAGNOSIS — J101 Influenza due to other identified influenza virus with other respiratory manifestations: Secondary | ICD-10-CM

## 2023-07-15 LAB — POCT INFLUENZA A/B
Influenza A, POC: POSITIVE — AB
Influenza B, POC: NEGATIVE

## 2023-07-15 MED ORDER — ONDANSETRON 4 MG PO TBDP: 4.0000 mg | ORAL_TABLET | Freq: Three times a day (TID) | ORAL | 0 refills | Status: AC | PRN

## 2023-07-15 MED ORDER — ONDANSETRON 4 MG PO TBDP
4.0000 mg | ORAL_TABLET | Freq: Once | ORAL | Status: AC
  Administered 2023-07-15: 4 mg via ORAL

## 2023-07-15 MED ORDER — OSELTAMIVIR PHOSPHATE 75 MG PO CAPS: 75.0000 mg | ORAL_CAPSULE | Freq: Two times a day (BID) | ORAL | 0 refills | Status: AC

## 2023-07-15 MED ORDER — PROMETHAZINE-DM 6.25-15 MG/5ML PO SYRP: 5.0000 mL | ORAL_SOLUTION | Freq: Every evening | ORAL | 0 refills | Status: AC | PRN

## 2023-07-15 NOTE — Discharge Instructions (Signed)
You have a viral illness which will improve on its own with rest, fluids, and medications to help with your symptoms.  Tylenol, guaifenesin (plain mucinex), and saline nasal sprays may help relieve symptoms.  Zofran every 8 hours as needed for nausea/vomiting.   Two teaspoons of honey in 1 cup of warm water every 4-6 hours may help with throat pains.  Humidifier in room at nighttime may help soothe cough (clean well daily).   For chest pain, shortness of breath, inability to keep food or fluids down without vomiting, fever that does not respond to tylenol or motrin, or any other severe symptoms, please go to the ER for further evaluation. Return to urgent care as needed, otherwise follow-up with PCP.

## 2023-07-15 NOTE — ED Provider Notes (Addendum)
[name]    CSN: [id number] Arrival date & time: 07/15/23  0900      History   Chief Complaint Chief Complaint  Patient presents with   Nausea   Cough    HPI Melody Patrick is a 28 y.o. female.   Patient presents to urgent care for evaluation of cough, nasal congestion, sore throat, and vomiting that started yesterday.  Sore throat is worsened by coughing.  Cough is productive with clear phlegm.  She has had 2-3 episodes of nonbloody/nonbilious emesis this morning and remains nauseous currently.  Last menstrual cycle was May 28, 2023.  She has a history of PCOS and irregular menstrual cycles, pregnancy test 2 days ago at home was negative.  Denies recent sick contacts with similar symptoms.  She is a current every day cigarette smoker, denies vape and other drug use.  Denies history of chronic respiratory problems.  Denies CP, SOB, diarrhea, dizziness, rash, and known fever at home.  Reports chills.  She has not attempted use of any over-the-counter medications to help with symptoms PTA.   Cough   Past Medical History:  Diagnosis Date   Eczema    IBS (irritable bowel syndrome)     Patient Active Problem List   Diagnosis Date Noted   Family history of breast cancer 08/20/2022    Past Surgical History:  Procedure Laterality Date   WISDOM TOOTH EXTRACTION      OB History     Gravida  2   Para  1   Term  1   Preterm      AB  1   Living  1      SAB  1   IAB      Ectopic      Multiple  0   Live Births  1            Home Medications    Prior to Admission medications   Medication Sig Start Date End Date Taking? Authorizing Provider  ondansetron (ZOFRAN-ODT) 4 MG disintegrating tablet Take 1 tablet (4 mg total) by mouth every 8 (eight) hours as needed for nausea or vomiting. 07/15/23  Yes Carlisle Beers, FNP  oseltamivir (TAMIFLU) 75 MG capsule Take 1 capsule (75 mg total) by mouth every 12 (twelve) hours. 07/15/23   Yes Carlisle Beers, FNP  promethazine-dextromethorphan (PROMETHAZINE-DM) 6.25-15 MG/5ML syrup Take 5 mLs by mouth at bedtime as needed for cough. 07/15/23  Yes Carlisle Beers, FNP  cetirizine (ZYRTEC ALLERGY) 10 MG tablet Take 1 tablet (10 mg total) by mouth at bedtime. 06/17/23   Viviano Simas, FNP  cyclobenzaprine (FLEXERIL) 5 MG tablet Take 1 tablet (5 mg total) by mouth at bedtime as needed for muscle spasms. Patient not taking: Reported on 07/15/2023 05/14/23   Wallis Bamberg, PA-C  fluconazole (DIFLUCAN) 150 MG tablet Take 1 tablet (150 mg total) by mouth once a week. Patient not taking: Reported on 07/15/2023 07/04/23   Wallis Bamberg, PA-C  metroNIDAZOLE (FLAGYL) 500 MG tablet Take 1 tablet (500 mg total) by mouth 2 (two) times daily for 7 days. Patient not taking: Reported on 07/15/2023 07/08/23 07/15/23  Zenia Resides, MD  mineral oil-hydrophilic petrolatum (AQUAPHOR) ointment Apply topically as needed for dry skin. 06/17/23   Viviano Simas, FNP  naproxen (NAPROSYN) 375 MG tablet Take 1 tablet (375 mg total) by mouth 2 (two) times daily with a meal. Patient not taking: Reported on 07/15/2023 05/14/23   Wallis Bamberg, PA-C  triamcinolone  cream (KENALOG) 0.1 % Apply 1 Application topically 2 (two) times daily. 06/17/23   Viviano Simas, FNP    Family History Family History  Problem Relation Age of Onset   Healthy Mother    Breast cancer Maternal Aunt        40s/50s   Breast cancer Maternal Aunt        40s/50s   Ovarian cancer Maternal Aunt        40s/50s   Hypertension Maternal Grandmother     Social History Social History   Tobacco Use   Smoking status: Every Day    Types: Cigars   Smokeless tobacco: Never  Vaping Use   Vaping status: Former  Substance Use Topics   Alcohol use: Yes    Comment: occasionally   Drug use: Yes    Types: Marijuana     Allergies   Patient has no known allergies.   Review of Systems Review of Systems  Respiratory:  Positive for  cough.   Per HPI   Physical Exam Triage Vital Signs ED Triage Vitals  Encounter Vitals Group     BP 07/15/23 0905 124/85     Systolic BP Percentile --      Diastolic BP Percentile --      Pulse Rate 07/15/23 0905 95     Resp 07/15/23 0905 17     Temp 07/15/23 0905 99.1 F (37.3 C)     Temp Source 07/15/23 0905 Oral     SpO2 07/15/23 0905 96 %     Weight --      Height --      Head Circumference --      Peak Flow --      Pain Score 07/15/23 0907 4     Pain Loc --      Pain Education --      Exclude from Growth Chart --    No data found.  Updated Vital Signs BP 124/85 (BP Location: Right Arm)   Pulse 95   Temp 99.1 F (37.3 C) (Oral)   Resp 17   LMP 05/28/2023   SpO2 96%   Visual Acuity Right Eye Distance:   Left Eye Distance:   Bilateral Distance:    Right Eye Near:   Left Eye Near:    Bilateral Near:     Physical Exam Vitals and nursing note reviewed.  Constitutional:      Appearance: She is not ill-appearing or toxic-appearing.  HENT:     Head: Normocephalic and atraumatic.     Right Ear: Hearing, tympanic membrane, ear canal and external ear normal.     Left Ear: Hearing, tympanic membrane, ear canal and external ear normal.     Nose: Congestion present.     Mouth/Throat:     Lips: Pink.     Mouth: Mucous membranes are moist. No injury or oral lesions.     Dentition: Normal dentition.     Tongue: No lesions.     Pharynx: Oropharynx is clear. Uvula midline. No pharyngeal swelling, oropharyngeal exudate, posterior oropharyngeal erythema, uvula swelling or postnasal drip.     Tonsils: No tonsillar exudate.  Eyes:     General: Lids are normal. Vision grossly intact. Gaze aligned appropriately.     Extraocular Movements: Extraocular movements intact.     Conjunctiva/sclera: Conjunctivae normal.  Neck:     Trachea: Trachea and phonation normal.  Cardiovascular:     Rate and Rhythm: Normal rate and regular rhythm.     Heart  sounds: Normal heart sounds,  S1 normal and S2 normal.  Pulmonary:     Effort: Pulmonary effort is normal. No respiratory distress.     Breath sounds: Normal breath sounds and air entry. No wheezing, rhonchi or rales.  Chest:     Chest wall: No tenderness.  Musculoskeletal:     Cervical back: Neck supple.  Lymphadenopathy:     Cervical: No cervical adenopathy.  Skin:    General: Skin is warm and dry.     Capillary Refill: Capillary refill takes less than 2 seconds.     Findings: No rash.  Neurological:     General: No focal deficit present.     Mental Status: She is alert and oriented to person, place, and time. Mental status is at baseline.     Cranial Nerves: No dysarthria or facial asymmetry.  Psychiatric:        Mood and Affect: Mood normal.        Speech: Speech normal.        Behavior: Behavior normal.        Thought Content: Thought content normal.        Judgment: Judgment normal.      UC Treatments / Results  Labs (all labs ordered are listed, but only abnormal results are displayed) Labs Reviewed  POCT INFLUENZA A/B - Abnormal; Notable for the following components:      Result Value   Influenza A, POC Positive (*)    All other components within normal limits    EKG   Radiology No results found.  Procedures Procedures (including critical care time)  Medications Ordered in UC Medications  ondansetron (ZOFRAN-ODT) disintegrating tablet 4 mg (4 mg Oral Given 07/15/23 0913)    Initial Impression / Assessment and Plan / UC Course  I have reviewed the triage vital signs and the nursing notes.  Pertinent labs & imaging results that were available during my care of the patient were reviewed by me and considered in my medical decision making (see chart for details).   1. Influenza A, cigarette nicotine dependence without complication Flu A point of care testing positive.  Lungs clear, vitals hemodynamically stable, therefore deferred imaging. Interventions in clinic: zofran with some  relief of nausea prior to discharge Offered antiviral given timing of illness, Tamiflu sent to pharmacy.  Recommend supportive care for further symptomatic relief as outlined in AVS.  Modes of transmission, quarantine recommendations, and hand hygiene discussed.  Counseled patient on potential for adverse effects with medications prescribed/recommended today, strict ER and return-to-clinic precautions discussed, patient verbalized understanding.    Final Clinical Impressions(s) / UC Diagnoses   Final diagnoses:  Viral URI with cough  Cigarette nicotine dependence without complication  Influenza A     Discharge Instructions      You have a viral illness which will improve on its own with rest, fluids, and medications to help with your symptoms.  Tylenol, guaifenesin (plain mucinex), and saline nasal sprays may help relieve symptoms.  Zofran every 8 hours as needed for nausea/vomiting.   Two teaspoons of honey in 1 cup of warm water every 4-6 hours may help with throat pains.  Humidifier in room at nighttime may help soothe cough (clean well daily).   For chest pain, shortness of breath, inability to keep food or fluids down without vomiting, fever that does not respond to tylenol or motrin, or any other severe symptoms, please go to the ER for further evaluation. Return to urgent care as needed,  otherwise follow-up with PCP.       ED Prescriptions     Medication Sig Dispense Auth. Provider   ondansetron (ZOFRAN-ODT) 4 MG disintegrating tablet Take 1 tablet (4 mg total) by mouth every 8 (eight) hours as needed for nausea or vomiting. 20 tablet Carlisle Beers, FNP   promethazine-dextromethorphan (PROMETHAZINE-DM) 6.25-15 MG/5ML syrup Take 5 mLs by mouth at bedtime as needed for cough. 118 mL Reita May M, FNP   oseltamivir (TAMIFLU) 75 MG capsule Take 1 capsule (75 mg total) by mouth every 12 (twelve) hours. 10 capsule Carlisle Beers, FNP      PDMP not  reviewed this encounter.     Reita May Sage, [place] 07/15/23 [phone]

## 2023-07-15 NOTE — ED Triage Notes (Signed)
Pt presents with sore throat that began last night. Early this morning she became nauseous and has been throwing up.

## 2023-07-28 ENCOUNTER — Ambulatory Visit: Payer: Medicaid Other | Admitting: Podiatry

## 2023-08-19 ENCOUNTER — Ambulatory Visit (INDEPENDENT_AMBULATORY_CARE_PROVIDER_SITE_OTHER): Admitting: Podiatry

## 2023-08-19 ENCOUNTER — Ambulatory Visit: Payer: Medicaid Other | Admitting: Podiatry

## 2023-08-19 ENCOUNTER — Encounter: Payer: Self-pay | Admitting: Podiatry

## 2023-08-19 DIAGNOSIS — L600 Ingrowing nail: Secondary | ICD-10-CM

## 2023-08-19 MED ORDER — NEOMYCIN-POLYMYXIN-HC 1 % OT SOLN: OTIC | 0 refills | Status: AC

## 2023-08-19 NOTE — Progress Notes (Signed)
  Subjective:  Patient ID: Melody Patrick, female    DOB: [birth year],  MRN: [id number]  Chief Complaint  Patient presents with   Follow-up    Patient states that everything has been good since last visit, no pain nor discomfort. Patient wanted to know if she is abl to cut it down?.    28 y.o. female presents with the above complaint. History confirmed with patient.  Right side is doing well she would like to have the same done on the left second and third toe as well now  Objective:  Physical Exam: warm, good capillary refill, no trophic changes or ulcerative lesions, normal DP and PT pulses, normal sensory exam, and right second toe matricectomy site healing well pincer nail deformity left second and third toenails  Assessment:   1. Ingrown nail      Plan:  Patient was evaluated and treated and all questions answered.    Ingrown Nail, left -Patient elects to proceed with minor surgery to remove ingrown toenail today. Consent reviewed and signed by patient. -Ingrown nail excised. See procedure note. -Educated on post-procedure care including soaking. Written instructions provided and reviewed. -Rx for Cortisporin sent to pharmacy. -Advised on signs and symptoms of infection developing.  We discussed that the phenol likely will create some redness and edema and tenderness around the nailbed as long as it is localized this is to be expected.  Will return as needed if any infection signs develop  Procedure: Excision of Ingrown Toenail Location: Left 2nd and third toe medial nail borders. Anesthesia: Lidocaine 1% plain; 1.5 mL and Marcaine 0.5% plain; 1.5 mL, digital block. Skin Prep: Betadine. Dressing: Silvadene; telfa; dry, sterile, compression dressing. Technique: Following skin prep, the toe was exsanguinated and a tourniquet was secured at the base of the toe. The affected nail border was freed, split with a nail splitter, and excised. Chemical matrixectomy was then  performed with phenol and irrigated out with alcohol. The tourniquet was then removed and sterile dressing applied. Disposition: Patient tolerated procedure well.    Return if symptoms worsen or fail to improve.

## 2023-10-22 ENCOUNTER — Ambulatory Visit
Admission: RE | Admit: 2023-10-22 | Discharge: 2023-10-22 | Disposition: A | Discharge status: Home / Self Care | Source: Ambulatory Visit | Attending: Family Medicine | Admitting: Family Medicine

## 2023-10-22 ENCOUNTER — Other Ambulatory Visit: Payer: Self-pay

## 2023-10-22 VITALS — BP 125/79 | HR 65 | Temp 98.3°F | Resp 16

## 2023-10-22 DIAGNOSIS — N644 Mastodynia: Secondary | ICD-10-CM | POA: Diagnosis not present

## 2023-10-22 DIAGNOSIS — Z113 Encounter for screening for infections with a predominantly sexual mode of transmission: Secondary | ICD-10-CM | POA: Diagnosis not present

## 2023-10-22 HISTORY — DX: Polycystic ovarian syndrome: E28.2

## 2023-10-22 LAB — POCT URINE PREGNANCY: Preg Test, Ur: NEGATIVE

## 2023-10-22 NOTE — ED Triage Notes (Signed)
 Pt states her breast have been painful most of the month whether on or off menstrual. Pt states wants STI testing. Pt denies sx at this time.

## 2023-10-22 NOTE — ED Provider Notes (Addendum)
 [facility]    CSN: [id number] Arrival date & time: 10/22/23  1022      History   Chief Complaint Chief Complaint  Patient presents with   Breast Problem    My breast have been sore for weeks even after menstruation, I need to get std/sti testing as well. - Entered by patient    HPI Melody Patrick is a 28 y.o. female presents for STD screening and breast tenderness.  Patient reports that for the past month or so she has been having bilateral breast tenderness that is all the time.  States she normally will have its during her right before periods but this has been more persistent.  States it is both breast.  Denies any changes in shape colors.  No rashes or nipple discharge.  No injury.  She is not on birth control.  Denies pregnancy.  In addition she would like to have STD screening.  Denies any symptoms including dysuria, vaginal discharge and no known STD exposure.  Patient does have PCOS.  No other concerns at this time.  HPI  Past Medical History:  Diagnosis Date   Eczema    IBS (irritable bowel syndrome)    PCOS (polycystic ovarian syndrome)     Patient Active Problem List   Diagnosis Date Noted   Family history of breast cancer 08/20/2022    Past Surgical History:  Procedure Laterality Date   WISDOM TOOTH EXTRACTION      OB History     Gravida  2   Para  1   Term  1   Preterm      AB  1   Living  1      SAB  1   IAB      Ectopic      Multiple  0   Live Births  1            Home Medications    Prior to Admission medications   Medication Sig Start Date End Date Taking? Authorizing Provider  cetirizine  (ZYRTEC  ALLERGY) 10 MG tablet Take 1 tablet (10 mg total) by mouth at bedtime. 06/17/23   Mardene Shake, FNP  cyclobenzaprine  (FLEXERIL ) 5 MG tablet Take 1 tablet (5 mg total) by mouth at bedtime as needed for muscle spasms. 05/14/23   Adolph Hoop, PA-C  fluconazole  (DIFLUCAN ) 150 MG tablet Take 1 tablet (150 mg total) by  mouth once a week. 07/04/23   Adolph Hoop, PA-C  mineral oil-hydrophilic petrolatum (AQUAPHOR) ointment Apply topically as needed for dry skin. 06/17/23   Mardene Shake, FNP  naproxen  (NAPROSYN ) 375 MG tablet Take 1 tablet (375 mg total) by mouth 2 (two) times daily with a meal. 05/14/23   Adolph Hoop, PA-C  NEOMYCIN -POLYMYXIN-HYDROCORTISONE  (CORTISPORIN) 1 % SOLN OTIC solution Apply to nail beds from procedure site twice daily after soaks 08/19/23   McDonald, Olive Better, DPM  ondansetron  (ZOFRAN -ODT) 4 MG disintegrating tablet Take 1 tablet (4 mg total) by mouth every 8 (eight) hours as needed for nausea or vomiting. 07/15/23   Starlene Eaton, FNP  oseltamivir  (TAMIFLU ) 75 MG capsule Take 1 capsule (75 mg total) by mouth every 12 (twelve) hours. 07/15/23   Starlene Eaton, FNP  promethazine -dextromethorphan (PROMETHAZINE -DM) 6.25-15 MG/5ML syrup Take 5 mLs by mouth at bedtime as needed for cough. 07/15/23   Starlene Eaton, FNP  triamcinolone  cream (KENALOG ) 0.1 % Apply 1 Application topically 2 (two) times daily. 06/17/23   Mardene Shake, FNP  Family History Family History  Problem Relation Age of Onset   Healthy Mother    Breast cancer Maternal Aunt        40s/50s   Breast cancer Maternal Aunt        40s/50s   Ovarian cancer Maternal Aunt        40s/50s   Hypertension Maternal Grandmother     Social History Social History   Tobacco Use   Smoking status: Every Day    Types: Cigars   Smokeless tobacco: Never  Vaping Use   Vaping status: Former  Substance Use Topics   Alcohol use: Yes    Comment: occasionally   Drug use: Yes    Types: Marijuana     Allergies   Patient has no known allergies.   Review of Systems Review of Systems  Skin:        Bilateral breast tenderness     Physical Exam Triage Vital Signs ED Triage Vitals  Encounter Vitals Group     BP 10/22/23 1040 125/79     Systolic BP Percentile --      Diastolic BP Percentile --      Pulse  Rate 10/22/23 1040 65     Resp 10/22/23 1040 16     Temp 10/22/23 1040 98.3 F (36.8 C)     Temp Source 10/22/23 1040 Oral     SpO2 10/22/23 1040 98 %     Weight --      Height --      Head Circumference --      Peak Flow --      Pain Score 10/22/23 1038 8     Pain Loc --      Pain Education --      Exclude from Growth Chart --    No data found.  Updated Vital Signs BP 125/79   Pulse 65   Temp 98.3 F (36.8 C) (Oral)   Resp 16   LMP 09/25/2023   SpO2 98%   Visual Acuity Right Eye Distance:   Left Eye Distance:   Bilateral Distance:    Right Eye Near:   Left Eye Near:    Bilateral Near:     Physical Exam Vitals and nursing note reviewed. Exam conducted with a chaperone present Rosalia Colonel RT).  Constitutional:      General: She is not in acute distress.    Appearance: Normal appearance. She is not ill-appearing.  HENT:     Head: Normocephalic and atraumatic.  Eyes:     Pupils: Pupils are equal, round, and reactive to light.  Cardiovascular:     Rate and Rhythm: Normal rate.  Pulmonary:     Effort: Pulmonary effort is normal.  Chest:  Breasts:    Breasts are symmetrical.     Right: Tenderness present. No swelling, bleeding, inverted nipple, mass, nipple discharge or skin change.     Left: Tenderness present. No swelling, bleeding, inverted nipple, mass, nipple discharge or skin change.     Comments: Mild tenderness bilateral breast between the 5 to 7 o'clock position Lymphadenopathy:     Upper Body:     Right upper body: No axillary adenopathy.     Left upper body: No axillary adenopathy.  Skin:    General: Skin is warm and dry.  Neurological:     General: No focal deficit present.     Mental Status: She is alert and oriented to person, place, and time.  Psychiatric:  Mood and Affect: Mood normal.        Behavior: Behavior normal.      UC Treatments / Results  Labs (all labs ordered are listed, but only abnormal results are displayed) Labs  Reviewed  RPR  HIV ANTIBODY (ROUTINE TESTING W REFLEX)  POCT URINE PREGNANCY  CERVICOVAGINAL ANCILLARY ONLY    EKG   Radiology No results found.  Procedures Procedures (including critical care time)  Medications Ordered in UC Medications - No data to display  Initial Impression / Assessment and Plan / UC Course  I have reviewed the triage vital signs and the nursing notes.  Pertinent labs & imaging results that were available during my care of the patient were reviewed by me and considered in my medical decision making (see chart for details).     Reviewed exam and symptoms with patient. No red flags.  Breast exam some mild tenderness but otherwise no abnormalities, lesions or masses.  Discussed likely hormonal fluctuations as patient does have PCOS.  hCG negative.  Advised her to follow-up with her gynecologist for further workup of the symptoms.  STD testing is ordered we will contact for any positive results.  ER precautions reviewed and patient verbalized understanding. Final Clinical Impressions(s) / UC Diagnoses   Final diagnoses:  Breast tenderness  Screening examination for STD (sexually transmitted disease)     Discharge Instructions      The clinic will contact you with the STD testing done today if positive.  Please follow-up with her gynecologist regarding her bilateral breast tenderness as your exam was normal today.  Please go to the ER for any worsening symptoms.  Hope you feel better soon!     ED Prescriptions   None    PDMP not reviewed this encounter.   Alleen Arbour, NP 10/22/23 1116    Alleen Arbour, NP 10/22/23 1118

## 2023-10-22 NOTE — Discharge Instructions (Addendum)
 The clinic will contact you with the STD testing done today if positive.  Please follow-up with her gynecologist regarding her bilateral breast tenderness as your exam was normal today.  Please go to the ER for any worsening symptoms.  Hope you feel better soon!

## 2023-10-23 LAB — HIV ANTIBODY (ROUTINE TESTING W REFLEX): HIV Screen 4th Generation wRfx: NONREACTIVE

## 2023-10-23 LAB — CERVICOVAGINAL ANCILLARY ONLY
Chlamydia: NEGATIVE
Comment: NEGATIVE
Comment: NEGATIVE
Comment: NORMAL
Neisseria Gonorrhea: NEGATIVE
Trichomonas: NEGATIVE

## 2023-10-23 LAB — RPR: RPR Ser Ql: NONREACTIVE

## 2023-10-28 ENCOUNTER — Encounter: Payer: Self-pay | Admitting: Obstetrics and Gynecology

## 2023-12-09 ENCOUNTER — Ambulatory Visit

## 2024-04-16 ENCOUNTER — Telehealth: Admitting: Physician Assistant

## 2024-04-16 DIAGNOSIS — L309 Dermatitis, unspecified: Secondary | ICD-10-CM

## 2024-04-16 MED ORDER — PREDNISONE 20 MG PO TABS: 20.0000 mg | ORAL_TABLET | Freq: Every day | ORAL | 0 refills | Status: AC

## 2024-04-16 NOTE — Progress Notes (Signed)

## 2024-05-10 ENCOUNTER — Inpatient Hospital Stay: Admission: RE | Admit: 2024-05-10 | Source: Ambulatory Visit

## 2024-05-11 ENCOUNTER — Ambulatory Visit
Admission: EM | Admit: 2024-05-11 | Discharge: 2024-05-11 | Disposition: A | Discharge status: Home / Self Care | Attending: Internal Medicine | Admitting: Internal Medicine

## 2024-05-11 ENCOUNTER — Other Ambulatory Visit: Payer: Self-pay

## 2024-05-11 VITALS — BP 120/82 | HR 77 | Temp 98.1°F | Resp 16

## 2024-05-11 DIAGNOSIS — N3000 Acute cystitis without hematuria: Secondary | ICD-10-CM | POA: Diagnosis not present

## 2024-05-11 DIAGNOSIS — Z113 Encounter for screening for infections with a predominantly sexual mode of transmission: Secondary | ICD-10-CM | POA: Insufficient documentation

## 2024-05-11 DIAGNOSIS — R350 Frequency of micturition: Secondary | ICD-10-CM | POA: Diagnosis not present

## 2024-05-11 LAB — POCT URINE DIPSTICK
Bilirubin, UA: NEGATIVE
Glucose, UA: NEGATIVE mg/dL
Ketones, POC UA: NEGATIVE mg/dL
Leukocytes, UA: NEGATIVE
Nitrite, UA: POSITIVE — AB
POC PROTEIN,UA: NEGATIVE
Spec Grav, UA: >=1.03 — AB (ref 1.010–1.025)
Urobilinogen, UA: 0.2 U/dL
pH, UA: 6 (ref 5.0–8.0)

## 2024-05-11 LAB — POCT URINE PREGNANCY: Preg Test, Ur: NEGATIVE

## 2024-05-11 MED ORDER — SULFAMETHOXAZOLE-TRIMETHOPRIM 800-160 MG PO TABS: 1.0000 | ORAL_TABLET | Freq: Two times a day (BID) | ORAL | 0 refills | Status: AC

## 2024-05-11 NOTE — ED Provider Notes (Signed)
 [facility]    CSN: [id number] Arrival date & time: 05/11/24  1633      History   Chief Complaint Chief Complaint  Patient presents with   Urinary Frequency    HPI Melody Patrick is a 28 y.o. female.   28 year old female who presents urgent care with complaints of urinary frequency and urgency.  She reports her symptoms started about a week ago.  She was not sure what was wrong as she was having go to the bathroom multiple times daily and only using a small amount.  She denies any dysuria, fever, hematuria, abdominal pain, vaginal discharge or vaginal irritation.  She has not had any nausea or vomiting.  She would like to have screening examination for STIs done today.   Urinary Frequency Pertinent negatives include no chest pain, no abdominal pain and no shortness of breath.    Past Medical History:  Diagnosis Date   Eczema    IBS (irritable bowel syndrome)    PCOS (polycystic ovarian syndrome)     Patient Active Problem List   Diagnosis Date Noted   Family history of breast cancer 08/20/2022    Past Surgical History:  Procedure Laterality Date   WISDOM TOOTH EXTRACTION      OB History     Gravida  2   Para  1   Term  1   Preterm      AB  1   Living  1      SAB  1   IAB      Ectopic      Multiple  0   Live Births  1            Home Medications    Prior to Admission medications   Medication Sig Start Date End Date Taking? Authorizing Provider  sulfamethoxazole -trimethoprim  (BACTRIM  DS) 800-160 MG tablet Take 1 tablet by mouth 2 (two) times daily for 3 days. 05/11/24 05/14/24 Yes Sasan Wilkie A, PA-C  cetirizine  (ZYRTEC  ALLERGY) 10 MG tablet Take 1 tablet (10 mg total) by mouth at bedtime. 06/17/23   Kennyth Domino, FNP  cyclobenzaprine  (FLEXERIL ) 5 MG tablet Take 1 tablet (5 mg total) by mouth at bedtime as needed for muscle spasms. 05/14/23   Christopher Savannah, PA-C  fluconazole  (DIFLUCAN ) 150 MG tablet Take 1 tablet (150  mg total) by mouth once a week. 07/04/23   Christopher Savannah, PA-C  mineral oil-hydrophilic petrolatum (AQUAPHOR) ointment Apply topically as needed for dry skin. 06/17/23   Kennyth Domino, FNP  naproxen  (NAPROSYN ) 375 MG tablet Take 1 tablet (375 mg total) by mouth 2 (two) times daily with a meal. 05/14/23   Christopher Savannah, PA-C  NEOMYCIN -POLYMYXIN-HYDROCORTISONE  (CORTISPORIN) 1 % SOLN OTIC solution Apply to nail beds from procedure site twice daily after soaks 08/19/23   McDonald, Juliene SAUNDERS, DPM  ondansetron  (ZOFRAN -ODT) 4 MG disintegrating tablet Take 1 tablet (4 mg total) by mouth every 8 (eight) hours as needed for nausea or vomiting. 07/15/23   Enedelia Dorna HERO, FNP  oseltamivir  (TAMIFLU ) 75 MG capsule Take 1 capsule (75 mg total) by mouth every 12 (twelve) hours. 07/15/23   Enedelia Dorna HERO, FNP  promethazine -dextromethorphan (PROMETHAZINE -DM) 6.25-15 MG/5ML syrup Take 5 mLs by mouth at bedtime as needed for cough. 07/15/23   Enedelia Dorna HERO, FNP  triamcinolone  cream (KENALOG ) 0.1 % Apply 1 Application topically 2 (two) times daily. 06/17/23   Kennyth Domino, FNP    Family History Family History  Problem Relation Age of Onset  Healthy Mother    Breast cancer Maternal Aunt        40s/50s   Breast cancer Maternal Aunt        40s/50s   Ovarian cancer Maternal Aunt        40s/50s   Hypertension Maternal Grandmother     Social History Social History   Tobacco Use   Smoking status: Every Day    Types: Cigars   Smokeless tobacco: Never  Vaping Use   Vaping status: Former  Substance Use Topics   Alcohol use: Yes    Comment: occasionally   Drug use: Yes    Types: Marijuana     Allergies   Patient has no known allergies.   Review of Systems Review of Systems  Constitutional:  Negative for chills and fever.  HENT:  Negative for ear pain and sore throat.   Eyes:  Negative for pain and visual disturbance.  Respiratory:  Negative for cough and shortness of breath.    Cardiovascular:  Negative for chest pain and palpitations.  Gastrointestinal:  Negative for abdominal pain and vomiting.  Genitourinary:  Positive for frequency and urgency. Negative for dysuria, hematuria, vaginal bleeding, vaginal discharge and vaginal pain.  Musculoskeletal:  Negative for arthralgias and back pain.  Skin:  Negative for color change and rash.  Neurological:  Negative for seizures and syncope.  All other systems reviewed and are negative.    Physical Exam Triage Vital Signs ED Triage Vitals  Encounter Vitals Group     BP 05/11/24 1639 120/82     Girls Systolic BP Percentile --      Girls Diastolic BP Percentile --      Boys Systolic BP Percentile --      Boys Diastolic BP Percentile --      Pulse Rate 05/11/24 1639 77     Resp 05/11/24 1639 16     Temp 05/11/24 1639 98.1 F (36.7 C)     Temp Source 05/11/24 1639 Oral     SpO2 05/11/24 1639 98 %     Weight --      Height --      Head Circumference --      Peak Flow --      Pain Score 05/11/24 1638 0     Pain Loc --      Pain Education --      Exclude from Growth Chart --    No data found.  Updated Vital Signs BP 120/82   Pulse 77   Temp 98.1 F (36.7 C) (Oral)   Resp 16   LMP 04/18/2024   SpO2 98%   Visual Acuity Right Eye Distance:   Left Eye Distance:   Bilateral Distance:    Right Eye Near:   Left Eye Near:    Bilateral Near:     Physical Exam Vitals and nursing note reviewed.  Constitutional:      General: She is not in acute distress.    Appearance: She is well-developed.  HENT:     Head: Normocephalic and atraumatic.  Eyes:     Conjunctiva/sclera: Conjunctivae normal.  Cardiovascular:     Rate and Rhythm: Normal rate and regular rhythm.     Heart sounds: No murmur heard. Pulmonary:     Effort: Pulmonary effort is normal. No respiratory distress.     Breath sounds: Normal breath sounds.  Abdominal:     Palpations: Abdomen is soft.     Tenderness: There is no abdominal  tenderness.  Musculoskeletal:        General: No swelling.     Cervical back: Neck supple.  Skin:    General: Skin is warm and dry.     Capillary Refill: Capillary refill takes less than 2 seconds.  Neurological:     Mental Status: She is alert.  Psychiatric:        Mood and Affect: Mood normal.      UC Treatments / Results  Labs (all labs ordered are listed, but only abnormal results are displayed) Labs Reviewed  POCT URINE DIPSTICK - Abnormal; Notable for the following components:      Result Value   Clarity, UA cloudy (*)    Spec Grav, UA >=1.030 (*)    Blood, UA trace-intact (*)    Nitrite, UA Positive (*)    All other components within normal limits  POCT URINE PREGNANCY  CERVICOVAGINAL ANCILLARY ONLY    EKG   Radiology No results found.  Procedures Procedures (including critical care time)  Medications Ordered in UC Medications - No data to display  Initial Impression / Assessment and Plan / UC Course  I have reviewed the triage vital signs and the nursing notes.  Pertinent labs & imaging results that were available during my care of the patient were reviewed by me and considered in my medical decision making (see chart for details).     Urinary frequency  Acute cystitis without hematuria  Screening examination for STI   Urinalysis done today is consistent with a urinary tract infection as is the symptoms.  We will treat this with antibiotics by mouth.  We will treat with the following: Sulfamethoxazole -trimethoprim  (Bactrim  DS) 800/160 mg twice daily for 3 days.  This is an antibiotic.  Take this with food.  Make sure to stay hydrated by drinking plenty of water. Also, Screening swab done today and results will be available in 24-48 hours. We will contact you if we need to arrange additional treatment based on your testing. Negative results will be on your MyChart account. Abstain from sex until you receive your final results.  Use a condom for sexual  encounters.  Return to urgent care or PCP if symptoms worsen or fail to resolve.    Final Clinical Impressions(s) / UC Diagnoses   Final diagnoses:  Urinary frequency  Acute cystitis without hematuria  Screening examination for STI     Discharge Instructions      Urinalysis done today is consistent with a urinary tract infection as is the symptoms.  We will treat this with antibiotics by mouth.  We will treat with the following: Sulfamethoxazole -trimethoprim  (Bactrim  DS) 800/160 mg twice daily for 3 days.  This is an antibiotic.  Take this with food.  Make sure to stay hydrated by drinking plenty of water. Also, Screening swab done today and results will be available in 24-48 hours. We will contact you if we need to arrange additional treatment based on your testing. Negative results will be on your MyChart account. Abstain from sex until you receive your final results.  Use a condom for sexual encounters.  Return to urgent care or PCP if symptoms worsen or fail to resolve.      ED Prescriptions     Medication Sig Dispense Auth. Provider   sulfamethoxazole -trimethoprim  (BACTRIM  DS) 800-160 MG tablet Take 1 tablet by mouth 2 (two) times daily for 3 days. 6 tablet Teresa Almarie LABOR, PA-C      PDMP not reviewed this encounter.   Johnpaul Gillentine,  Almarie LABOR, PA-C 05/11/24 1653

## 2024-05-11 NOTE — Discharge Instructions (Addendum)
 Urinalysis done today is consistent with a urinary tract infection as is the symptoms.  We will treat this with antibiotics by mouth.  We will treat with the following: Sulfamethoxazole -trimethoprim  (Bactrim  DS) 800/160 mg twice daily for 3 days.  This is an antibiotic.  Take this with food.  Make sure to stay hydrated by drinking plenty of water. Also, Screening swab done today and results will be available in 24-48 hours. We will contact you if we need to arrange additional treatment based on your testing. Negative results will be on your MyChart account. Abstain from sex until you receive your final results.  Use a condom for sexual encounters.  Return to urgent care or PCP if symptoms worsen or fail to resolve.

## 2024-05-11 NOTE — ED Triage Notes (Signed)
 Pt c/o frequent urinationx1wk

## 2024-05-12 LAB — CERVICOVAGINAL ANCILLARY ONLY
Chlamydia: NEGATIVE
Comment: NEGATIVE
Comment: NEGATIVE
Comment: NORMAL
Neisseria Gonorrhea: NEGATIVE
Trichomonas: NEGATIVE

## 2024-06-30 ENCOUNTER — Ambulatory Visit: Payer: Self-pay

## 2024-07-02 ENCOUNTER — Ambulatory Visit
Admission: RE | Admit: 2024-07-02 | Discharge: 2024-07-02 | Disposition: A | Discharge status: Home / Self Care | Payer: Self-pay | Source: Ambulatory Visit | Attending: Family Medicine | Admitting: Family Medicine

## 2024-07-02 VITALS — BP 114/76 | HR 76 | Temp 97.9°F | Resp 18

## 2024-07-02 DIAGNOSIS — Z113 Encounter for screening for infections with a predominantly sexual mode of transmission: Secondary | ICD-10-CM | POA: Insufficient documentation

## 2024-07-02 DIAGNOSIS — N898 Other specified noninflammatory disorders of vagina: Secondary | ICD-10-CM | POA: Diagnosis present

## 2024-07-02 MED ORDER — METRONIDAZOLE 500 MG PO TABS: 500.0000 mg | ORAL_TABLET | Freq: Two times a day (BID) | ORAL | 0 refills | Status: AC

## 2024-07-02 MED ORDER — FLUCONAZOLE 150 MG PO TABS: 150.0000 mg | ORAL_TABLET | ORAL | 0 refills | Status: AC

## 2024-07-02 NOTE — ED Provider Notes (Signed)
 " [facility]  Note:  This document was prepared using Conservation officer, historic buildings and may include unintentional dictation errors.  MRN: [id number] DOB: [birth year]  Subjective:   Melody Patrick is a 29 y.o. female presenting for 1 week history of vaginal malodor, strong vaginal odor.  Denies fever, n/v, abdominal pain, pelvic pain, rashes, dysuria, urinary frequency, hematuria, vaginal discharge.  Would like complete STI screen.  Has a history of BV and yeast infections and would like coverage for this.  Current Outpatient Medications  Medication Instructions   cetirizine  (ZYRTEC  ALLERGY) 10 mg, Oral, Daily at bedtime   cyclobenzaprine  (FLEXERIL ) 5 mg, Oral, At bedtime PRN   fluconazole  (DIFLUCAN ) 150 mg, Oral, Weekly   mineral oil-hydrophilic petrolatum (AQUAPHOR) ointment Topical, As needed   naproxen  (NAPROSYN ) 375 mg, Oral, 2 times daily with meals   NEOMYCIN -POLYMYXIN-HYDROCORTISONE  (CORTISPORIN) 1 % SOLN OTIC solution Apply to nail beds from procedure site twice daily after soaks   ondansetron  (ZOFRAN -ODT) 4 mg, Oral, Every 8 hours PRN   oseltamivir  (TAMIFLU ) 75 mg, Oral, Every 12 hours   promethazine -dextromethorphan (PROMETHAZINE -DM) 6.25-15 MG/5ML syrup 5 mLs, Oral, At bedtime PRN   triamcinolone  cream (KENALOG ) 0.1 % 1 Application, Topical, 2 times daily    Allergies[1]  Past Medical History:  Diagnosis Date   Eczema    IBS (irritable bowel syndrome)    PCOS (polycystic ovarian syndrome)      Past Surgical History:  Procedure Laterality Date   WISDOM TOOTH EXTRACTION      Family History  Problem Relation Age of Onset   Healthy Mother    Breast cancer Maternal Aunt        40s/50s   Breast cancer Maternal Aunt        40s/50s   Ovarian cancer Maternal Aunt        40s/50s   Hypertension Maternal Grandmother     Social History   Occupational History   Not on file  Tobacco Use   Smoking status: Every Day    Types: Cigars    Smokeless tobacco: Never  Vaping Use   Vaping status: Former  Substance and Sexual Activity   Alcohol use: Yes    Comment: occasionally   Drug use: Yes    Types: Marijuana   Sexual activity: Yes    Birth control/protection: None     ROS   Objective:   Vitals: BP 114/76 (BP Location: Left Arm)   Pulse 76   Temp 97.9 F (36.6 C) (Oral)   Resp 18   LMP 06/05/2024   SpO2 98%   Physical Exam Constitutional:      General: She is not in acute distress.    Appearance: Normal appearance. She is well-developed. She is not ill-appearing, toxic-appearing or diaphoretic.  HENT:     Head: Normocephalic and atraumatic.     Nose: Nose normal.     Mouth/Throat:     Mouth: Mucous membranes are moist.  Eyes:     General: No scleral icterus.       Right eye: No discharge.        Left eye: No discharge.     Extraocular Movements: Extraocular movements intact.  Cardiovascular:     Rate and Rhythm: Normal rate.  Pulmonary:     Effort: Pulmonary effort is normal.  Skin:    General: Skin is warm and dry.  Neurological:     General: No focal deficit present.     Mental Status: She is  alert and oriented to person, place, and time.  Psychiatric:        Mood and Affect: Mood normal.        Behavior: Behavior normal.     Assessment and Plan :   PDMP not reviewed this encounter.  1. Vaginal odor   2. Screening examination for STI      We will treat patient empirically at patient request for bacterial vaginosis with Flagyl  and for yeast vaginitis with fluconazole .  Labs pending. Counseled patient on potential for adverse effects with medications prescribed/recommended today, ER and return-to-clinic precautions discussed, patient verbalized understanding.     [1] No Known Allergies    Christopher Savannah, PA-C 07/02/24 1029  "

## 2024-07-02 NOTE — ED Triage Notes (Signed)
 Pt requesting STD testing-states smells weird/denies vaginal d/c-denies known exposure-NAD-steady gait

## 2024-07-03 LAB — SYPHILIS: RPR W/REFLEX TO RPR TITER AND TREPONEMAL ANTIBODIES, TRADITIONAL SCREENING AND DIAGNOSIS ALGORITHM: RPR Ser Ql: NONREACTIVE

## 2024-07-03 LAB — HIV ANTIBODY (ROUTINE TESTING W REFLEX): HIV Screen 4th Generation wRfx: NONREACTIVE

## 2024-07-05 ENCOUNTER — Ambulatory Visit (HOSPITAL_COMMUNITY): Payer: Self-pay

## 2024-07-05 LAB — CERVICOVAGINAL ANCILLARY ONLY
Bacterial Vaginitis (gardnerella): POSITIVE — AB
Candida Glabrata: NEGATIVE
Candida Vaginitis: POSITIVE — AB
Chlamydia: NEGATIVE
Comment: NEGATIVE
Comment: NEGATIVE
Comment: NEGATIVE
Comment: NEGATIVE
Comment: NEGATIVE
Comment: NORMAL
Neisseria Gonorrhea: NEGATIVE
Trichomonas: NEGATIVE

## 2024-07-19 DIAGNOSIS — E282 Polycystic ovarian syndrome: Secondary | ICD-10-CM | POA: Insufficient documentation

## 2024-07-19 NOTE — Progress Notes (Unsigned)
 "  PCP:  Pcp, No   No chief complaint on file.    HPI:      [name] is a 29 y.o. G2P1011 whose LMP was Patient's last menstrual period was 06/05/2024., presents today for her annual examination.  Her menses are every 90 days with provera  use (after neg UPT), lasting 3-5 days, light to mod flow, no BTB, no dysmen. Pt had spontaneous menses 2/24 that was 7 days light flow only, no dysmen. Sx and labs suggestive of PCOS. Had monthly menses before last pregnancy. Did depo after last pregnancy and hasn't had normal periods since.   Sex activity: single partner, contraception - none. Conception ok. Stopped taking PNVs. Last Pap: 07/16/21 Results were: no abnormalities /neg HPV DNA Neg STD testing 1/26.   There is a FH of breast cancer in 2 mat grt aunts. There is a possible FH of ovarian cancer in another mat grt aunt. The patient does not do self-breast exams. Noticed clear nipple d/c with manipulation yesterday; no masses. Normal TSH/prolactin 1/23  Tobacco use: black and milds daily Alcohol use: social drinker Daily MJ use.   Exercise: moderately active  She does not get adequate calcium and Vitamin D  in her diet.  Patient Active Problem List   Diagnosis Date Noted   Family history of breast cancer 08/20/2022    Past Surgical History:  Procedure Laterality Date   WISDOM TOOTH EXTRACTION      Family History  Problem Relation Age of Onset   Healthy Mother    Breast cancer Maternal Aunt        40s/50s   Breast cancer Maternal Aunt        40s/50s   Ovarian cancer Maternal Aunt        40s/50s   Hypertension Maternal Grandmother     Social History   Socioeconomic History   Marital status: Single    Spouse name: Not on file   Number of children: Not on file   Years of education: Not on file   Highest education level: Not on file  Occupational History   Not on file  Tobacco Use   Smoking status: Every Day    Types: Cigars   Smokeless tobacco: Never   Vaping Use   Vaping status: Former  Substance and Sexual Activity   Alcohol use: Yes    Comment: occasionally   Drug use: Yes    Types: Marijuana   Sexual activity: Yes    Birth control/protection: None  Other Topics Concern   Not on file  Social History Narrative   Not on file   Social Drivers of Health   Tobacco Use: High Risk (07/02/2024)   Patient History    Smoking Tobacco Use: Every Day    Smokeless Tobacco Use: Never    Passive Exposure: Not on file  Financial Resource Strain: Not on file  Food Insecurity: Not on file  Transportation Needs: Not on file  Physical Activity: Not on file  Stress: Not on file  Social Connections: Not on file  Intimate Partner Violence: Not on file  Depression (EYV7-0): Not on file  Alcohol Screen: Not on file  Housing: Not on file  Utilities: Not on file  Health Literacy: Not on file     Current Outpatient Medications:    cetirizine  (ZYRTEC  ALLERGY) 10 MG tablet, Take 1 tablet (10 mg total) by mouth at bedtime., Disp: 30 tablet, Rfl: 0   cyclobenzaprine  (FLEXERIL ) 5 MG tablet, Take 1 tablet (  5 mg total) by mouth at bedtime as needed for muscle spasms., Disp: 30 tablet, Rfl: 0   fluconazole  (DIFLUCAN ) 150 MG tablet, Take 1 tablet (150 mg total) by mouth once a week., Disp: 2 tablet, Rfl: 0   metroNIDAZOLE  (FLAGYL ) 500 MG tablet, Take 1 tablet (500 mg total) by mouth 2 (two) times daily., Disp: 14 tablet, Rfl: 0   mineral oil-hydrophilic petrolatum (AQUAPHOR) ointment, Apply topically as needed for dry skin., Disp: 420 g, Rfl: 0   naproxen  (NAPROSYN ) 375 MG tablet, Take 1 tablet (375 mg total) by mouth 2 (two) times daily with a meal., Disp: 30 tablet, Rfl: 0   NEOMYCIN -POLYMYXIN-HYDROCORTISONE  (CORTISPORIN) 1 % SOLN OTIC solution, Apply to nail beds from procedure site twice daily after soaks, Disp: 10 mL, Rfl: 0   ondansetron  (ZOFRAN -ODT) 4 MG disintegrating tablet, Take 1 tablet (4 mg total) by mouth every 8 (eight) hours as needed for  nausea or vomiting., Disp: 20 tablet, Rfl: 0   oseltamivir  (TAMIFLU ) 75 MG capsule, Take 1 capsule (75 mg total) by mouth every 12 (twelve) hours., Disp: 10 capsule, Rfl: 0   promethazine -dextromethorphan (PROMETHAZINE -DM) 6.25-15 MG/5ML syrup, Take 5 mLs by mouth at bedtime as needed for cough., Disp: 118 mL, Rfl: 0   triamcinolone  cream (KENALOG ) 0.1 %, Apply 1 Application topically 2 (two) times daily., Disp: 30 g, Rfl: 0     ROS:  Review of Systems  Constitutional:  Negative for fatigue, fever and unexpected weight change.  Respiratory:  Negative for cough, shortness of breath and wheezing.   Cardiovascular:  Negative for chest pain, palpitations and leg swelling.  Gastrointestinal:  Negative for blood in stool, constipation, diarrhea, nausea and vomiting.  Endocrine: Negative for cold intolerance, heat intolerance and polyuria.  Genitourinary:  Negative for dyspareunia, dysuria, flank pain, frequency, genital sores, hematuria, menstrual problem, pelvic pain, urgency, vaginal bleeding, vaginal discharge and vaginal pain.  Musculoskeletal:  Negative for back pain, joint swelling and myalgias.  Skin:  Negative for rash.  Neurological:  Negative for dizziness, syncope, light-headedness, numbness and headaches.  Hematological:  Negative for adenopathy.  Psychiatric/Behavioral:  Negative for agitation, confusion, sleep disturbance and suicidal ideas. The patient is not nervous/anxious.    BREAST: nipple d/c   Objective: LMP 06/05/2024    Physical Exam Constitutional:      Appearance: She is well-developed.  Genitourinary:     Vulva normal.     Right Labia: No rash, tenderness or lesions.    Left Labia: No tenderness, lesions or rash.    No vaginal discharge, erythema or tenderness.      Right Adnexa: not tender and no mass present.    Left Adnexa: not tender and no mass present.    No cervical friability or polyp.     Uterus is not enlarged or tender.  Breasts:    Right: No  mass, nipple discharge, skin change or tenderness.     Left: No mass, nipple discharge, skin change or tenderness.  Neck:     Thyroid : No thyromegaly.  Cardiovascular:     Rate and Rhythm: Normal rate and regular rhythm.     Heart sounds: Normal heart sounds. No murmur heard. Pulmonary:     Effort: Pulmonary effort is normal.     Breath sounds: Normal breath sounds.  Abdominal:     Palpations: Abdomen is soft.     Tenderness: There is no abdominal tenderness. There is no guarding or rebound.  Musculoskeletal:        General:  Normal range of motion.     Cervical back: Normal range of motion.  Lymphadenopathy:     Cervical: No cervical adenopathy.  Neurological:     General: No focal deficit present.     Mental Status: She is alert and oriented to person, place, and time.     Cranial Nerves: No cranial nerve deficit.  Skin:    General: Skin is warm and dry.  Psychiatric:        Mood and Affect: Mood normal.        Behavior: Behavior normal.        Thought Content: Thought content normal.        Judgment: Judgment normal.  Vitals reviewed.     Assessment/Plan: Encounter for annual routine gynecological examination  Amenorrhea - Plan: medroxyPROGESTERone  (PROVERA ) 10 MG tablet; pt doing well with Q90 days dosing and wants to cont. Labs suggestive of PCOS. Pt would like to conceive. Restart PNVs. F/u prn.   Galactorrhea--neg exam, neg labs 1/23. D/C MJ use (could affect sx). F/u prn.   No orders of the defined types were placed in this encounter.            GYN counsel adequate intake of calcium and vitamin D , diet and exercise     F/U  No follow-ups on file.  Amil Moseman B. Maedell Hedger, PA-C 07/19/2024 10:24 AM "

## 2024-07-22 ENCOUNTER — Ambulatory Visit: Admitting: Obstetrics and Gynecology

## 2024-07-22 DIAGNOSIS — Z124 Encounter for screening for malignant neoplasm of cervix: Secondary | ICD-10-CM

## 2024-07-22 DIAGNOSIS — Z01419 Encounter for gynecological examination (general) (routine) without abnormal findings: Secondary | ICD-10-CM

## 2024-07-22 DIAGNOSIS — E282 Polycystic ovarian syndrome: Secondary | ICD-10-CM

## 2024-08-04 ENCOUNTER — Ambulatory Visit: Admitting: Obstetrics and Gynecology
